# Patient Record
Sex: Male | Born: 1985 | Race: White | Hispanic: No | Marital: Married | Smoking: Former smoker
Health system: Southern US, Community
[De-identification: ages and names within clinical notes are randomized; demographics above are authoritative.]

## PROBLEM LIST (undated history)

## (undated) DIAGNOSIS — I10 Essential (primary) hypertension: Secondary | ICD-10-CM

## (undated) DIAGNOSIS — E669 Obesity, unspecified: Secondary | ICD-10-CM

## (undated) DIAGNOSIS — J189 Pneumonia, unspecified organism: Secondary | ICD-10-CM

## (undated) DIAGNOSIS — K219 Gastro-esophageal reflux disease without esophagitis: Secondary | ICD-10-CM

## (undated) DIAGNOSIS — F32A Depression, unspecified: Secondary | ICD-10-CM

## (undated) DIAGNOSIS — K5792 Diverticulitis of intestine, part unspecified, without perforation or abscess without bleeding: Secondary | ICD-10-CM

## (undated) DIAGNOSIS — F329 Major depressive disorder, single episode, unspecified: Secondary | ICD-10-CM

## (undated) DIAGNOSIS — G4730 Sleep apnea, unspecified: Secondary | ICD-10-CM

## (undated) DIAGNOSIS — F419 Anxiety disorder, unspecified: Secondary | ICD-10-CM

## (undated) DIAGNOSIS — G4700 Insomnia, unspecified: Secondary | ICD-10-CM

## (undated) HISTORY — PX: APPENDECTOMY: SHX54

## (undated) HISTORY — PX: WISDOM TOOTH EXTRACTION: SHX21

## (undated) HISTORY — PX: VASECTOMY: SHX75

## (undated) HISTORY — PX: COLONOSCOPY: SHX174

---

## 2013-05-19 ENCOUNTER — Ambulatory Visit: Payer: Self-pay | Admitting: Family Medicine

## 2014-08-18 ENCOUNTER — Ambulatory Visit: Payer: Self-pay | Admitting: Urology

## 2014-12-22 NOTE — Op Note (Signed)
PATIENT NAME:  Jeffrey Keller, Jeffrey Keller MR#:  [id number] DATE OF BIRTH:  [birth year]  DATE OF PROCEDURE:  08/18/2014  PREOPERATIVE DIAGNOSIS: Desires vasectomy.   POSTOPERATIVE DIAGNOSIS: Desires vasectomy.   PROCEDURE PERFORMED: Vasectomy.   ANESTHESIA: Monitored anesthesia care.   ATTENDING SURGEON: Claris Gladden, MD  SPECIMENS: None.   COMPLICATIONS: None.   INDICATION: This is a 29 year old male with 3 biological children who desires no further offspring. He underwent attempted vasectomy in the office; however, I was unable to isolate the vas due to a tight scrotum and thickened cord. My partner, Dr. Edwyna Shell, was also unsuccessful in the office. He was counseled to have the procedure done in the Operating Room with some sedation in order to help facilitate isolation of the structure. Risks and benefits of the procedure were explained in detail. The patient agreed to proceed as planned.   PROCEDURE: The patient was correctly identified in the preoperative holding area and informed consent was confirmed. He was brought to the operating suite and placed on the table in the supine position. At this time, a universal timeout protocol was performed. All team members were identified. Venodyne boots were placed and he was administered 600 mg of IV clindamycin given his PENICILLIN ALLERGY in the perioperative period. He was then placed under monitored anesthesia care with some sedation and a privacy drape was hung. He was prepped and draped in the standard surgical fashion. At this point in time, I attempted to isolate the right vas and a small incision was made in the right lateral hemiscrotum. I made several attempts to isolate the vas through this incision but was ultimately unsuccessful due to a very thickened cord. The decision was made to proceed with a larger midline incision through which both vasa could be identified. A good amount of local anesthesia using 0.5% Marcaine was administered in the midline,  subcutaneous skin, and bilateral cords for a total of 20 mL. The incision was then carried down through the subcutaneous tissues using Bovie electrocautery. The left testicle was delivered into the field and the tunica vaginalis was opened. The cord was then identified including the cord structures and the vas was palpated. Of note, there was a lipoma of the cord and the cord was relatively thick. The vas was isolated using towel clips and dissected out and ultimately transected. A portion of the vas was excised. The lumen of each end of the vas was cauterized using pinpoint cautery through the lumen of the vas. The testicular end of the vas was buried with a fascial interposition using a 3-0 chromic suture. The testicle was returned back into the tunica vaginalis, which was closed using a figure-of-eight 3-0 Vicryl suture. The left testicle was then returned back into its normal anatomic position and the same exact procedure was performed on the right side. The right testicle was delivered into the field and the vas was isolated. A portion of the vas was excised after it was transected and the bilateral ends were cauterized using pinpoint electrocautery within the lumen. Again, a fascial interposition was also performed on this side and the testicle was returned back into the tunica vaginalis and placed in normal anatomic position within the scrotum. The dartos was then closed using a running 3-0 chromic suture. The skin was closed using interrupted 3-0 chromic sutures. After the patient was cleaned and dried, Dermabond was additionally applied to the incision. Scrotal fluffs and a scrotal support device were applied. The patient was reversed from monitored anesthesia care,  taken to the PACU. He tolerated the procedure quite well. There were no complications in this case.    ____________________________ Claris GladdenAshley J. Calayah Guadarrama, MD ajb:at D: 08/18/2014 12:54:56 ET T: 08/18/2014 13:46:27  ET JOB#: [id number]  cc: Claris GladdenAshley J. Claudine Stallings, MD, <Dictator> Claris GladdenASHLEY J Elinor Kleine MD ELECTRONICALLY SIGNED 09/08/2014 15:44

## 2017-09-23 ENCOUNTER — Emergency Department (HOSPITAL_COMMUNITY)
Admission: EM | Admit: 2017-09-23 | Discharge: 2017-09-23 | Disposition: A | Discharge status: Home / Self Care | Payer: Managed Care, Other (non HMO) | Attending: Emergency Medicine | Admitting: Emergency Medicine

## 2017-09-23 ENCOUNTER — Other Ambulatory Visit: Payer: Self-pay

## 2017-09-23 ENCOUNTER — Encounter (HOSPITAL_COMMUNITY): Payer: Self-pay | Admitting: Nurse Practitioner

## 2017-09-23 DIAGNOSIS — R112 Nausea with vomiting, unspecified: Secondary | ICD-10-CM | POA: Diagnosis not present

## 2017-09-23 DIAGNOSIS — R197 Diarrhea, unspecified: Secondary | ICD-10-CM | POA: Diagnosis not present

## 2017-09-23 DIAGNOSIS — Z79899 Other long term (current) drug therapy: Secondary | ICD-10-CM | POA: Insufficient documentation

## 2017-09-23 DIAGNOSIS — R6883 Chills (without fever): Secondary | ICD-10-CM | POA: Insufficient documentation

## 2017-09-23 DIAGNOSIS — R1032 Left lower quadrant pain: Secondary | ICD-10-CM | POA: Diagnosis present

## 2017-09-23 HISTORY — DX: Major depressive disorder, single episode, unspecified: F32.9

## 2017-09-23 HISTORY — DX: Anxiety disorder, unspecified: F41.9

## 2017-09-23 HISTORY — DX: Diverticulitis of intestine, part unspecified, without perforation or abscess without bleeding: K57.92

## 2017-09-23 HISTORY — DX: Depression, unspecified: F32.A

## 2017-09-23 LAB — COMPREHENSIVE METABOLIC PANEL
ALBUMIN: 3.6 g/dL (ref 3.5–5.0)
ALK PHOS: 47 U/L (ref 38–126)
ALT: 37 U/L (ref 17–63)
AST: 26 U/L (ref 15–41)
Anion gap: 9 (ref 5–15)
BUN: 18 mg/dL (ref 6–20)
CALCIUM: 8.1 mg/dL — AB (ref 8.9–10.3)
CO2: 24 mmol/L (ref 22–32)
CREATININE: 0.93 mg/dL (ref 0.61–1.24)
Chloride: 106 mmol/L (ref 101–111)
GFR calc non Af Amer: >60 mL/min (ref >60)
GLUCOSE: 112 mg/dL — AB (ref 65–99)
Potassium: 3.6 mmol/L (ref 3.5–5.1)
SODIUM: 139 mmol/L (ref 135–145)
Total Bilirubin: 0.6 mg/dL (ref 0.3–1.2)
Total Protein: 7 g/dL (ref 6.5–8.1)

## 2017-09-23 LAB — CBC
HCT: 44.2 % (ref 39.0–52.0)
Hemoglobin: 15.3 g/dL (ref 13.0–17.0)
MCH: 29.7 pg (ref 26.0–34.0)
MCHC: 34.6 g/dL (ref 30.0–36.0)
MCV: 85.8 fL (ref 78.0–100.0)
PLATELETS: 197 10*3/uL (ref 150–400)
RBC: 5.15 MIL/uL (ref 4.22–5.81)
RDW: 13.1 % (ref 11.5–15.5)
WBC: 4.6 10*3/uL (ref 4.0–10.5)

## 2017-09-23 LAB — URINALYSIS, ROUTINE W REFLEX MICROSCOPIC
BILIRUBIN URINE: NEGATIVE
GLUCOSE, UA: NEGATIVE mg/dL
HGB URINE DIPSTICK: NEGATIVE
Ketones, ur: NEGATIVE mg/dL
Leukocytes, UA: NEGATIVE
Nitrite: NEGATIVE
PROTEIN: NEGATIVE mg/dL
Specific Gravity, Urine: 1.028 (ref 1.005–1.030)
pH: 5 (ref 5.0–8.0)

## 2017-09-23 LAB — LIPASE, BLOOD: Lipase: 21 U/L (ref 11–51)

## 2017-09-23 MED ORDER — METRONIDAZOLE 500 MG PO TABS: 500.0000 mg | ORAL_TABLET | Freq: Three times a day (TID) | ORAL | 0 refills | Status: DC

## 2017-09-23 MED ORDER — ONDANSETRON 4 MG PO TBDP
4.0000 mg | ORAL_TABLET | Freq: Once | ORAL | Status: AC | PRN
  Administered 2017-09-23: 4 mg via ORAL
  Filled 2017-09-23: qty 1

## 2017-09-23 MED ORDER — OXYCODONE-ACETAMINOPHEN 5-325 MG PO TABS: 1.0000 | ORAL_TABLET | ORAL | Status: DC | PRN

## 2017-09-23 MED ORDER — ONDANSETRON 4 MG PO TBDP
4.0000 mg | ORAL_TABLET | Freq: Three times a day (TID) | ORAL | 1 refills | Status: DC | PRN
Start: 2017-09-23 — End: 2020-03-18

## 2017-09-23 MED ORDER — OXYCODONE-ACETAMINOPHEN 5-325 MG PO TABS
2.0000 | ORAL_TABLET | Freq: Once | ORAL | Status: AC
  Administered 2017-09-23: 2 via ORAL
  Filled 2017-09-23: qty 2

## 2017-09-23 MED ORDER — CIPROFLOXACIN HCL 500 MG PO TABS
500.0000 mg | ORAL_TABLET | Freq: Once | ORAL | Status: AC
Start: 2017-09-23 — End: 2017-09-23
  Administered 2017-09-23: 500 mg via ORAL
  Filled 2017-09-23: qty 1

## 2017-09-23 MED ORDER — IBUPROFEN 400 MG PO TABS
400.0000 mg | ORAL_TABLET | Freq: Once | ORAL | Status: AC
  Administered 2017-09-23: 400 mg via ORAL
  Filled 2017-09-23: qty 1

## 2017-09-23 MED ORDER — DICYCLOMINE HCL 20 MG PO TABS: 20.0000 mg | ORAL_TABLET | Freq: Two times a day (BID) | ORAL | 0 refills | Status: DC | PRN

## 2017-09-23 MED ORDER — CIPROFLOXACIN HCL 500 MG PO TABS: 500.0000 mg | ORAL_TABLET | Freq: Two times a day (BID) | ORAL | 0 refills | Status: DC

## 2017-09-23 MED ORDER — ONDANSETRON 4 MG PO TBDP
8.0000 mg | ORAL_TABLET | Freq: Once | ORAL | Status: AC
  Administered 2017-09-23: 8 mg via ORAL
  Filled 2017-09-23: qty 2

## 2017-09-23 MED ORDER — METRONIDAZOLE 500 MG PO TABS
500.0000 mg | ORAL_TABLET | Freq: Once | ORAL | Status: AC
Start: 2017-09-23 — End: 2017-09-23
  Administered 2017-09-23: 500 mg via ORAL
  Filled 2017-09-23: qty 1

## 2017-09-23 NOTE — ED Provider Notes (Signed)
[facility] Provider Note   CSN: [id number] Arrival date & time: 09/23/17  1708    History   Chief Complaint Chief Complaint  Patient presents with  . Abdominal Pain    HPI Jeffrey Keller is a 32 y.o. male.  32 year old male with a history of diverticulitis diagnosed on colonoscopy at the age of 32 presents to the emergency department for evaluation of abdominal pain.  Abdominal pain began 48 hours ago and has been gradually worsening.  Pain located in the left lower quadrant.  He developed nausea and vomiting Saturday evening.  This has persisted through today.  He developed diarrhea yesterday and has had multiple loose stool.  No history of melena or hematochezia.  He also denies hematemesis, urinary symptoms.  He has had intermittent chills and endorses subjective fever; however, patient has been afebrile since arrival in the emergency department.  Patient states that symptoms are consistent with past episodes of diverticulitis.  Abdominal surgical history significant for appendectomy.  He is followed by a gastroenterologist with regular CT scans.  He reports a history of colon cancer in his brother with demise at the age of 34.      Past Medical History:  Diagnosis Date  . Anxiety   . Depression   . Diverticulitis     There are no active problems to display for this patient.   Past Surgical History:  Procedure Laterality Date  . APPENDECTOMY    . COLONOSCOPY         Home Medications    Prior to Admission medications   Medication Sig Start Date End Date Taking? Authorizing Provider  escitalopram (LEXAPRO) 10 MG tablet Take 10 mg by mouth daily.   Yes [provider]  Ibuprofen-Famotidine 800-26.6 MG TABS Take 1 tablet by mouth 3 (three) times daily as needed (pain/headache). Duexis   Yes [provider]  ciprofloxacin (CIPRO) 500 MG tablet Take 1 tablet (500 mg total) by mouth every 12 (twelve) hours. 09/23/17    Antony MaduraHumes, Maciel Kegg, PA-C  dicyclomine (BENTYL) 20 MG tablet Take 1 tablet (20 mg total) by mouth 2 (two) times daily as needed (abdominal pain/cramping). 09/23/17   Antony MaduraHumes, Jack Mineau, PA-C  metroNIDAZOLE (FLAGYL) 500 MG tablet Take 1 tablet (500 mg total) by mouth 3 (three) times daily. 09/23/17   Antony MaduraHumes, Mable Lashley, PA-C  ondansetron (ZOFRAN ODT) 4 MG disintegrating tablet Take 1-2 tablets (4-8 mg total) by mouth every 8 (eight) hours as needed for nausea or vomiting. 09/23/17   Antony MaduraHumes, Trev Boley, PA-C    Family History No family history on file.  Social History Social History   Tobacco Use  . Smoking status: Never Smoker  . Smokeless tobacco: Never Used  Substance Use Topics  . Alcohol use: Yes    Comment: weekend  . Drug use: No     Allergies   Penicillins   Review of Systems Review of Systems Ten systems reviewed and are negative for acute change, except as noted in the HPI.    Physical Exam Updated Vital Signs BP 101/60   Pulse 63   Temp 98.3 F (36.8 C) (Oral)   Resp 17   Ht 6' (1.829 m)   Wt 136.1 kg (300 lb)   SpO2 98%   BMI 40.69 kg/m   Physical Exam Constitutional: He is oriented to person, place, and time. He appears well-developed and well-nourished. No distress.  Nontoxic appearing and in NAD  HENT:  Head: Normocephalic and atraumatic.  Eyes: Conjunctivae and  EOM are normal. No scleral icterus.  Neck: Normal range of motion.  Cardiovascular: Normal rate, regular rhythm and intact distal pulses.  Pulmonary/Chest: Effort normal. No stridor. No respiratory distress. He has no wheezes.  Respirations even and unlabored  Abdominal: Soft. He exhibits no mass. There is tenderness. There is no guarding.  Soft abdomen with LLQ TTP. No peritoneal signs or distension.  Musculoskeletal: Normal range of motion.  Neurological: He is alert and oriented to person, place, and time. He exhibits normal muscle tone. Coordination normal.  Skin: Skin is warm and dry. No rash noted. He is not  diaphoretic. No erythema. No pallor.  Psychiatric: He has a normal mood and affect. His behavior is normal.  Nursing note and vitals reviewed.   ED Treatments / Results  Labs (all labs ordered are listed, but only abnormal results are displayed) Labs Reviewed  COMPREHENSIVE METABOLIC PANEL - Abnormal; Notable for the following components:      Result Value   Glucose, Bld 112 (*)    Calcium 8.1 (*)    All other components within normal limits  LIPASE, BLOOD  CBC  URINALYSIS, ROUTINE W REFLEX MICROSCOPIC    EKG  EKG Interpretation None       Radiology No results found.  Procedures Procedures (including critical care time)  Medications Ordered in ED Medications  ondansetron (ZOFRAN-ODT) disintegrating tablet 4 mg (4 mg Oral Given 09/23/17 1819)  ibuprofen (ADVIL,MOTRIN) tablet 400 mg (400 mg Oral Given 09/23/17 1823)  ondansetron (ZOFRAN-ODT) disintegrating tablet 8 mg (8 mg Oral Given 09/23/17 2259)  oxyCODONE-acetaminophen (PERCOCET/ROXICET) 5-325 MG per tablet 2 tablet (2 tablets Oral Given 09/23/17 2259)  ciprofloxacin (CIPRO) tablet 500 mg (500 mg Oral Given 09/23/17 2304)  metroNIDAZOLE (FLAGYL) tablet 500 mg (500 mg Oral Given 09/23/17 2304)     Initial Impression / Assessment and Plan / ED Course  I have reviewed the triage vital signs and the nursing notes.  Pertinent labs & imaging results that were available during my care of the patient were reviewed by me and considered in my medical decision making (see chart for details).     32 year old male with a history of diverticulitis diagnosed on colonoscopy at the age of 60 presents for left lower quadrant abdominal pain with associated nausea, vomiting, diarrhea.  Patient with focal tenderness in the left lower quadrant on exam.  No peritoneal signs.  Vital signs are stable and reassuring.  The patient also afebrile.  Laboratory workup initiated in triage.  This shows no leukocytosis or electrolyte derangements.   Liver and kidney function preserved.  Patient states that his symptoms feel similar to past diverticulitis flare.  Will empirically start on ciprofloxacin and Flagyl.  Low suspicion for complicating abdominal process.  Doubt intra-abdominal abscess or perforation.  Further doubt pSBO or SBO.  Discussed that symptoms may also be secondary to viral etiology.  Will provide prescriptions for supportive management including Bentyl and Zofran.  Return precautions discussed and provided. Patient discharged in stable condition with no unaddressed concerns.   Final Clinical Impressions(s) / ED Diagnoses   Final diagnoses:  Nausea vomiting and diarrhea    ED Discharge Orders        Ordered    ondansetron (ZOFRAN ODT) 4 MG disintegrating tablet  Every 8 hours PRN     09/23/17 2247    dicyclomine (BENTYL) 20 MG tablet  2 times daily PRN     09/23/17 2247    ciprofloxacin (CIPRO) 500 MG tablet  Every 12  hours     09/23/17 2247    metroNIDAZOLE (FLAGYL) 500 MG tablet  3 times daily     09/23/17 2247       Antony Madura, PA-C 09/23/17 2313    Shaune Pollack, MD 09/24/17 0040

## 2017-09-23 NOTE — ED Notes (Signed)
Results reviewed

## 2017-09-23 NOTE — ED Provider Notes (Signed)
Medical screening examination/treatment/procedure(s) were conducted as a shared visit with non-physician practitioner(s) and myself.  I personally evaluated the patient during the encounter. Briefly, the patient is a 32 year old male with history of diverticulitis diagnosed on colonoscopy at age 89 here with left lower quadrant pain, nausea, and loose stools.  Patient has a strong family history of recurrent diverticulitis and also lost a brother to colon cancer at a young age, so he regularly undergoes colonoscopy for screening.  On exam, the patient has minimal left lower quadrant tenderness but no rebound or guarding.  He has very reassuring lab work with a normal white blood cell count.  Vital signs are completely stable.  We discussed symptomatic treatment and empiric antibiotics versus CT imaging.  Patient states that he is normally able to resolve his episodes of diverticulitis with antibiotics and supportive care and does not feel CT is needed.  I feel this is very reasonable.  Based on shared decision making, will discharge with outpatient antibiotics and supportive care.  We discussed return precautions including any worsening pain, or other concerning symptoms.   EKG Interpretation None          Shaune PollackIsaacs, Jeffrey Wakefield, MD 09/23/17 2254

## 2017-09-23 NOTE — Discharge Instructions (Signed)
Your laboratory workup in the ED was reassuring.  Take ciprofloxacin and Flagyl as prescribed until finished for coverage of diverticulitis.  We recommend Bentyl for abdominal pain or cramping as well as Zofran for nausea.  Be sure you drink plenty of clear liquids to prevent dehydration.  Follow-up with your primary care doctor as well as your gastroenterologist regarding your visit today.  Return for new or concerning symptoms.

## 2017-09-23 NOTE — ED Triage Notes (Signed)
Pt endorses abdominal pain and cramping, diarrhea x4 days pt sts nausea and vomiting started 2 days ago. Pt endorses 6-7 episodes of diarrhea in the past 24 hours. Pt sts this is characteristic of his diverticulitis flare up but pain is more severe. Pt sts has been following diet appropriately but had significant stressor with death of father.  +appendectomy

## 2019-10-30 ENCOUNTER — Other Ambulatory Visit: Payer: Self-pay | Admitting: General Surgery

## 2019-10-30 ENCOUNTER — Other Ambulatory Visit (HOSPITAL_COMMUNITY): Payer: Self-pay | Admitting: General Surgery

## 2019-11-11 ENCOUNTER — Other Ambulatory Visit: Payer: Self-pay

## 2019-11-11 ENCOUNTER — Encounter: Payer: Managed Care, Other (non HMO) | Attending: General Surgery | Admitting: Skilled Nursing Facility1

## 2019-11-11 ENCOUNTER — Encounter: Payer: Self-pay | Admitting: Skilled Nursing Facility1

## 2019-11-11 DIAGNOSIS — Z79899 Other long term (current) drug therapy: Secondary | ICD-10-CM | POA: Diagnosis not present

## 2019-11-11 DIAGNOSIS — Z833 Family history of diabetes mellitus: Secondary | ICD-10-CM | POA: Insufficient documentation

## 2019-11-11 DIAGNOSIS — Z8249 Family history of ischemic heart disease and other diseases of the circulatory system: Secondary | ICD-10-CM | POA: Diagnosis not present

## 2019-11-11 DIAGNOSIS — Z87891 Personal history of nicotine dependence: Secondary | ICD-10-CM | POA: Diagnosis not present

## 2019-11-11 DIAGNOSIS — Z713 Dietary counseling and surveillance: Secondary | ICD-10-CM | POA: Diagnosis present

## 2019-11-11 DIAGNOSIS — F329 Major depressive disorder, single episode, unspecified: Secondary | ICD-10-CM | POA: Insufficient documentation

## 2019-11-11 DIAGNOSIS — E669 Obesity, unspecified: Secondary | ICD-10-CM

## 2019-11-11 DIAGNOSIS — F419 Anxiety disorder, unspecified: Secondary | ICD-10-CM | POA: Diagnosis not present

## 2019-11-11 DIAGNOSIS — I10 Essential (primary) hypertension: Secondary | ICD-10-CM | POA: Insufficient documentation

## 2019-11-11 DIAGNOSIS — Z8371 Family history of colonic polyps: Secondary | ICD-10-CM | POA: Diagnosis not present

## 2019-11-11 DIAGNOSIS — Z800 Family history of malignant neoplasm of digestive organs: Secondary | ICD-10-CM | POA: Insufficient documentation

## 2019-11-11 DIAGNOSIS — Z880 Allergy status to penicillin: Secondary | ICD-10-CM | POA: Diagnosis not present

## 2019-11-11 DIAGNOSIS — G4730 Sleep apnea, unspecified: Secondary | ICD-10-CM | POA: Insufficient documentation

## 2019-11-11 DIAGNOSIS — Z6841 Body Mass Index (BMI) 40.0 and over, adult: Secondary | ICD-10-CM | POA: Insufficient documentation

## 2019-11-11 NOTE — Progress Notes (Signed)
Nutrition Assessment for Bariatric Surgery Medical Nutrition Therapy  Patient was seen on 11/11/2019 for Pre-Operative Nutrition Assessment. Letter of approval faxed to [facility] bariatric surgery program coordinator on 11/11/2019   Referral stated Supervised Weight Loss (SWL) visits needed: 0  Planned surgery: sleeve Pt expectation of surgery: to lose weight Pt expectation of dietitian: none stated    NUTRITION ASSESSMENT   Anthropometrics  Start weight at [facility]: 314.3 lbs (date: 11/11/2019)  Height: 61.5 in BMI: 43.23 kg/m2     Clinical  Medical hx: diverticulosis (last flare being 2 years ago) Medications: see list Labs:  Notable signs/symptoms: none stated  Lifestyle & Dietary Hx  Pt states he eats massive quantities at once.   Pt states he drinks beer on the weekends (15).   24-Hr Dietary Recall First Meal: half pot coffee Snack: energy drink Second Meal: leftovers (chicken thighs, frozen noodles) Snack:  Third Meal: sauasage tacos and rice Snack: pork rinds Beverages: coffee, energy drink, water, beer, gatorade, soda   Estimated Energy Needs Calories: 1800   NUTRITION DIAGNOSIS  Overweight/obesity ([place]-3.3) related to past poor dietary habits and physical inactivity as evidenced by patient w/ planned Sleeve Gastrectomy surgery following dietary guidelines for continued weight loss.    NUTRITION INTERVENTION  Nutrition counseling (C-1) and education (E-2) to facilitate bariatric surgery goals.   Pre-Op Goals Reviewed with the Patient . Track food and beverage intake (pen and paper, MyFitness Pal, Baritastic app, etc.) . Make healthy food choices while monitoring portion sizes . Consume 3 meals per day or try to eat every 3-5 hours . Avoid concentrated sugars and fried foods . Keep sugar & fat in the single digits per serving on food labels . Practice CHEWING your food (aim for applesauce consistency) . Practice not drinking 15 minutes before,  during, and 30 minutes after each meal and snack . Avoid all carbonated beverages (ex: soda, sparkling beverages)  . Limit caffeinated beverages (ex: coffee, tea, energy drinks) . Avoid all sugar-sweetened beverages (ex: regular soda, sports drinks)  . Avoid alcohol  . Aim for 64-100 ounces of FLUID daily (with at least half of fluid intake being plain water)  . Aim for at least 60-80 grams of PROTEIN daily . Look for a liquid protein source that contains ?15 g protein and ?5 g carbohydrate (ex: shakes, drinks, shots) . Make a list of non-food related activities . Physical activity is an important part of a healthy lifestyle so keep it moving! The goal is to reach 150 minutes of exercise per week, including cardiovascular and weight baring activity.  *Goals that are bolded indicate the pt would like to start working towards these  Handouts Provided Include  . Bariatric Surgery handouts (Nutrition Visits, Pre-Op Goals, Protein Shakes, Vitamins & Minerals)  Learning Style & Readiness for Change Teaching method utilized: Visual & Auditory  Demonstrated degree of understanding via: Teach Back  Barriers to learning/adherence to lifestyle change: none identified     MONITORING & EVALUATION Dietary intake, weekly physical activity, body weight, and pre-op goals reached at next nutrition visit.    Next Steps  Patient is to follow up at [facility] for Pre-Op Class >2 weeks before surgery for further nutrition education.

## 2019-11-26 DIAGNOSIS — U071 COVID-19: Secondary | ICD-10-CM

## 2019-11-26 HISTORY — DX: COVID-19: U07.1

## 2019-12-11 ENCOUNTER — Ambulatory Visit: Payer: Managed Care, Other (non HMO)

## 2019-12-14 ENCOUNTER — Encounter (HOSPITAL_BASED_OUTPATIENT_CLINIC_OR_DEPARTMENT_OTHER): Payer: Self-pay | Admitting: *Deleted

## 2019-12-14 ENCOUNTER — Emergency Department (HOSPITAL_BASED_OUTPATIENT_CLINIC_OR_DEPARTMENT_OTHER): Payer: Managed Care, Other (non HMO)

## 2019-12-14 ENCOUNTER — Other Ambulatory Visit
Admission: RE | Admit: 2019-12-14 | Discharge: 2019-12-14 | Disposition: A | Discharge status: Home / Self Care | Payer: Managed Care, Other (non HMO) | Source: Ambulatory Visit | Attending: Pediatrics | Admitting: Pediatrics

## 2019-12-14 ENCOUNTER — Emergency Department (HOSPITAL_BASED_OUTPATIENT_CLINIC_OR_DEPARTMENT_OTHER)
Admission: EM | Admit: 2019-12-14 | Discharge: 2019-12-14 | Disposition: A | Discharge status: Home / Self Care | Payer: Managed Care, Other (non HMO) | Attending: Emergency Medicine | Admitting: Emergency Medicine

## 2019-12-14 ENCOUNTER — Other Ambulatory Visit: Payer: Self-pay

## 2019-12-14 DIAGNOSIS — R05 Cough: Secondary | ICD-10-CM | POA: Insufficient documentation

## 2019-12-14 DIAGNOSIS — Z79899 Other long term (current) drug therapy: Secondary | ICD-10-CM | POA: Insufficient documentation

## 2019-12-14 DIAGNOSIS — R0602 Shortness of breath: Secondary | ICD-10-CM | POA: Diagnosis not present

## 2019-12-14 DIAGNOSIS — U071 COVID-19: Secondary | ICD-10-CM | POA: Insufficient documentation

## 2019-12-14 LAB — CBC WITH DIFFERENTIAL/PLATELET
Abs Immature Granulocytes: 0.02 10*3/uL (ref 0.00–0.07)
Basophils Absolute: 0 10*3/uL (ref 0.0–0.1)
Basophils Relative: 0 %
Eosinophils Absolute: 0.1 10*3/uL (ref 0.0–0.5)
Eosinophils Relative: 2 %
HCT: 44.7 % (ref 39.0–52.0)
Hemoglobin: 15.6 g/dL (ref 13.0–17.0)
Immature Granulocytes: 0 %
Lymphocytes Relative: 31 %
Lymphs Abs: 1.7 10*3/uL (ref 0.7–4.0)
MCH: 30.2 pg (ref 26.0–34.0)
MCHC: 34.9 g/dL (ref 30.0–36.0)
MCV: 86.5 fL (ref 80.0–100.0)
Monocytes Absolute: 0.6 10*3/uL (ref 0.1–1.0)
Monocytes Relative: 10 %
Neutro Abs: 3.2 10*3/uL (ref 1.7–7.7)
Neutrophils Relative %: 57 %
Platelets: 210 10*3/uL (ref 150–400)
RBC: 5.17 MIL/uL (ref 4.22–5.81)
RDW: 12.2 % (ref 11.5–15.5)
WBC: 5.7 10*3/uL (ref 4.0–10.5)
nRBC: 0 % (ref 0.0–0.2)

## 2019-12-14 LAB — BASIC METABOLIC PANEL
Anion gap: 10 (ref 5–15)
BUN: 19 mg/dL (ref 6–20)
CO2: 26 mmol/L (ref 22–32)
Calcium: 9.1 mg/dL (ref 8.9–10.3)
Chloride: 101 mmol/L (ref 98–111)
Creatinine, Ser: 1.15 mg/dL (ref 0.61–1.24)
GFR calc Af Amer: >60 mL/min (ref >60)
GFR calc non Af Amer: >60 mL/min (ref >60)
Glucose, Bld: 99 mg/dL (ref 70–99)
Potassium: 3.9 mmol/L (ref 3.5–5.1)
Sodium: 137 mmol/L (ref 135–145)

## 2019-12-14 LAB — FIBRIN DERIVATIVES D-DIMER (ARMC ONLY): Fibrin derivatives D-dimer (ARMC): 510.29 ng/mL (FEU) — ABNORMAL HIGH (ref 0.00–499.00)

## 2019-12-14 MED ORDER — IOHEXOL 350 MG/ML SOLN
100.0000 mL | Freq: Once | INTRAVENOUS | Status: AC | PRN
  Administered 2019-12-14: 100 mL via INTRAVENOUS

## 2019-12-14 NOTE — ED Provider Notes (Signed)
[facility] EMERGENCY DEPARTMENT Provider Note   CSN: [id number] Arrival date & time: 12/14/19  1830     History Chief Complaint  Patient presents with  . Shortness of Breath    Jeffrey Keller is a 34 y.o. male.  HPI     Jeffrey Keller is a 34 y.o. male, with a history of anxiety, depression, Covid, presenting to the ED with cough and shortness of breath beginning Saturday, April 10. Tested positive for Covid Thursday, April 15. Went to a walk-in clinic today due to his shortness of breath.  There was opacity noted on the chest x-ray performed at that time.  They obtained a D-dimer and it was positive, which prompted them to send him to the ED for CT scan.  He additionally notes he was prescribed albuterol inhaler and course of prednisone by the outside facility.  Denies current fever, chest pain, abdominal pain, dizziness, syncope, N/V/D, lower extremity swelling/pain, or any other complaints.  Past Medical History:  Diagnosis Date  . Anxiety   . Depression   . Diverticulitis     There are no problems to display for this patient.   Past Surgical History:  Procedure Laterality Date  . APPENDECTOMY    . COLONOSCOPY         History reviewed. No pertinent family history.  Social History   Tobacco Use  . Smoking status: Never Smoker  . Smokeless tobacco: Never Used  Substance Use Topics  . Alcohol use: Yes    Comment: weekend  . Drug use: No    Home Medications Prior to Admission medications   Medication Sig Start Date End Date Taking? Authorizing Provider  ciprofloxacin (CIPRO) 500 MG tablet Take 1 tablet (500 mg total) by mouth every 12 (twelve) hours. 09/23/17   Antony Madura, PA-C  dicyclomine (BENTYL) 20 MG tablet Take 1 tablet (20 mg total) by mouth 2 (two) times daily as needed (abdominal pain/cramping). 09/23/17   Antony Madura, PA-C  escitalopram (LEXAPRO) 10 MG tablet Take 10 mg by mouth daily.    [provider]  Ibuprofen-Famotidine  800-26.6 MG TABS Take 1 tablet by mouth 3 (three) times daily as needed (pain/headache). Duexis    [provider]  metroNIDAZOLE (FLAGYL) 500 MG tablet Take 1 tablet (500 mg total) by mouth 3 (three) times daily. 09/23/17   Antony Madura, PA-C  ondansetron (ZOFRAN ODT) 4 MG disintegrating tablet Take 1-2 tablets (4-8 mg total) by mouth every 8 (eight) hours as needed for nausea or vomiting. 09/23/17   Antony Madura, PA-C    Allergies    Penicillins  Review of Systems   Review of Systems  Constitutional: Negative for chills, diaphoresis and fever.  Respiratory: Positive for cough and shortness of breath.   Cardiovascular: Negative for chest pain and leg swelling.  Gastrointestinal: Negative for abdominal pain, diarrhea, nausea and vomiting.  Neurological: Negative for dizziness, syncope and weakness.  All other systems reviewed and are negative.   Physical Exam Updated Vital Signs BP 126/76 (BP Location: Left Arm)   Pulse 69   Temp 98.4 F (36.9 C) (Oral)   Resp 18   Ht 6' (1.829 m)   Wt (!) 142.9 kg   SpO2 98%   BMI 42.72 kg/m   Physical Exam Vitals and nursing note reviewed.  Constitutional:      General: He is not in acute distress.    Appearance: He is well-developed. He is not diaphoretic.  HENT:     Head: Normocephalic and  atraumatic.     Mouth/Throat:     Mouth: Mucous membranes are moist.     Pharynx: Oropharynx is clear.  Eyes:     Conjunctiva/sclera: Conjunctivae normal.  Cardiovascular:     Rate and Rhythm: Normal rate and regular rhythm.     Pulses: Normal pulses.          Radial pulses are 2+ on the right side and 2+ on the left side.       Posterior tibial pulses are 2+ on the right side and 2+ on the left side.     Heart sounds: Normal heart sounds.     Comments: Tactile temperature in the extremities appropriate and equal bilaterally. Pulmonary:     Effort: Pulmonary effort is normal. No respiratory distress.     Breath sounds: Normal breath  sounds.     Comments: No increased work of breathing.  Speaks in full sentences without noted difficulty. Abdominal:     Palpations: Abdomen is soft.     Tenderness: There is no abdominal tenderness. There is no guarding.  Musculoskeletal:     Cervical back: Neck supple.     Right lower leg: No edema.     Left lower leg: No edema.     Comments: No lower extremity edema, color change, temperature abnormality, or tenderness.  Lymphadenopathy:     Cervical: No cervical adenopathy.  Skin:    General: Skin is warm and dry.  Neurological:     Mental Status: He is alert.  Psychiatric:        Mood and Affect: Mood and affect normal.        Speech: Speech normal.        Behavior: Behavior normal.     ED Results / Procedures / Treatments   Labs (all labs ordered are listed, but only abnormal results are displayed) Labs Reviewed  CBC WITH DIFFERENTIAL/PLATELET  BASIC METABOLIC PANEL    EKG None  Radiology CT Angio Chest PE W and/or Wo Contrast  Result Date: 12/14/2019 CLINICAL DATA:  Mild shortness of breath. COVID positive. EXAM: CT ANGIOGRAPHY CHEST WITH CONTRAST TECHNIQUE: Multidetector CT imaging of the chest was performed using the standard protocol during bolus administration of intravenous contrast. Multiplanar CT image reconstructions and MIPs were obtained to evaluate the vascular anatomy. CONTRAST:  OMNIPAQUE IOHEXOL 350 MG/ML SOLN COMPARISON:  None. FINDINGS: Cardiovascular: Satisfactory opacification of the pulmonary arteries to the segmental level. No evidence of pulmonary embolism. Normal heart size. No pericardial effusion. No thoracic aortic aneurysm or dissection. Mediastinum/Nodes: No enlarged mediastinal, hilar, or axillary lymph nodes. Small mediastinal and hilar lymph nodes are likely reactive. Thyroid gland, trachea, and esophagus demonstrate no significant findings. Lungs/Pleura: Patchy asymmetric ground-glass densities throughout both lungs. No pleural effusion  or pneumothorax. Upper Abdomen: No acute abnormality. Musculoskeletal: No chest wall abnormality. No acute or significant osseous findings. Review of the MIP images confirms the above findings. IMPRESSION: 1. No evidence of pulmonary embolism. 2. Patchy asymmetric ground-glass densities throughout both lungs, consistent with multifocal pneumonia related to COVID-19 infection. Electronically Signed   By: Obie Dredge M.D.   On: 12/14/2019 20:37    Procedures Procedures (including critical care time)  Medications Ordered in ED Medications  iohexol (OMNIPAQUE) 350 MG/ML injection 100 mL (100 mLs Intravenous Contrast Given 12/14/19 2023)    ED Course  I have reviewed the triage vital signs and the nursing notes.  Pertinent labs & imaging results that were available during my care of the patient  were reviewed by me and considered in my medical decision making (see chart for details).  Clinical Course as of Dec 15 1519  Mon Dec 14, 2019  2035 This is an erroneous reading calculated off the SPO2 sensor.  Patient was not noted to be tachypneic when manually reviewed.  Resp(!): 24 [SJ]    Clinical Course User Index [SJ] Darnel Mchan, Helane Gunther, PA-C   MDM Rules/Calculators/A&P                      Patient presents for CT scan due to positive D-dimer in the setting of COVID-19. Patient's notes and D-dimer results were reviewed.  D-dimer result noted to be 510 ng/mL, with normal range noted to be 0 to 499 ng/mL, thus indicating quite mild elevation. Mildly elevated D-dimer can be an expected finding in the setting of COVID-19 infection. I personally reviewed and interpreted the patient's labs and imaging studies.  Patient is nontoxic appearing, afebrile, not tachycardic, not tachypneic, not hypotensive, maintains excellent SPO2 on room air, and is in no apparent distress.  Lab work here in the ED was unremarkable and reassuring.  CT without evidence of PE.  Shows multifocal pneumonia consistent with  COVID-19 infection. Patient already has appropriate medications prescribed for symptom management. The patient was given instructions for home care as well as return precautions. Patient voices understanding of these instructions, accepts the plan, and is comfortable with discharge.  Vitals:   12/14/19 1851 12/14/19 1945 12/14/19 2000 12/14/19 2033  BP: 126/76 127/76 126/79 117/70  Pulse: 69 72 77 70  Resp: 18 18 (!) 26 (!) 24  Temp: 98.4 F (36.9 C)     TempSrc: Oral     SpO2: 98% 98% 99% 98%  Weight:      Height:       Jeffrey Keller was evaluated in Emergency Department on 12/14/2019 for the symptoms described in the history of present illness. He was evaluated in the context of the global COVID-19 pandemic, which necessitated consideration that the patient might be at risk for infection with the SARS-CoV-2 virus that causes COVID-19. Institutional protocols and algorithms that pertain to the evaluation of patients at risk for COVID-19 are in a state of rapid change based on information released by regulatory bodies including the CDC and federal and state organizations. These policies and algorithms were followed during the patient's care in the ED.   Final Clinical Impression(s) / ED Diagnoses Final diagnoses:  [id number]    Rx / DC Orders ED Discharge Orders    None       Layla Maw 12/15/19 [place], [place], DO 12/15/19 1604

## 2019-12-14 NOTE — ED Triage Notes (Signed)
covid positive today. Reports elevated d-dimer at PCP office. Denies CP. Reports slight SOB-ambulatory without noted distress. Speaking in complete sentences.

## 2019-12-14 NOTE — Discharge Instructions (Signed)
General Viral Syndrome Care Instructions:  Your symptoms are likely consistent with a viral illness, specifically COVID-19 infection. Viruses do not require or respond to antibiotics. Treatment is symptomatic care and it is important to note that these symptoms may last for 7-14 days.   Hand washing: Wash your hands throughout the day, but especially before and after touching the face, using the restroom, sneezing, coughing, or touching surfaces that have been coughed or sneezed upon. Hydration: Symptoms of most illnesses will be intensified and complicated by dehydration. Dehydration can also extend the duration of symptoms. Drink plenty of fluids and get plenty of rest. You should be drinking at least half a liter of water an hour to stay hydrated. Electrolyte drinks (ex. Gatorade, Powerade, Pedialyte) are also encouraged. You should be drinking enough fluids to make your urine light yellow, almost clear. If this is not the case, you are not drinking enough water. Please note that some of the treatments indicated below will not be effective if you are not adequately hydrated. Diet: Please concentrate on hydration, however, you may introduce food slowly.  Start with a clear liquid diet, progressed to a full liquid diet, and then bland solids as you are able. Pain or fever: Ibuprofen, Naproxen, or acetaminophen (generic for Tylenol) for pain or fever.  Antiinflammatory medications: Take 600 mg of ibuprofen every 6 hours or 440 mg (over the counter dose) to 500 mg (prescription dose) of naproxen every 12 hours for the next 3 days. After this time, these medications may be used as needed for pain. Take these medications with food to avoid upset stomach. Choose only one of these medications, do not take them together. Acetaminophen (generic for Tylenol): Should you continue to have additional pain while taking the ibuprofen or naproxen, you may add in acetaminophen as needed. Your daily total maximum amount  of acetaminophen from all sources should be limited to 4000mg /day for persons without liver problems, or 2000mg /day for those with liver problems. Albuterol: May use the albuterol as needed for instances of shortness of breath. Prednisone: Take the prednisone, as directed, in its entirety. Zyrtec or Claritin: May add these medication daily to control underlying symptoms of congestion, sneezing, and other signs of allergies.  These medications are available over-the-counter. Generics: Cetirizine (generic for Zyrtec) and loratadine (generic for Claritin). Fluticasone: Use fluticasone (generic for Flonase), as directed, for nasal and sinus congestion.  This medication is available over-the-counter. Congestion: Plain guaifenesin (generic for plain Mucinex) may help relieve congestion. Saline sinus rinses and saline nasal sprays may also help relieve congestion.  Sore throat: Warm liquids or Chloraseptic spray may help soothe a sore throat. Gargle twice a day with a salt water solution made from a half teaspoon of salt in a cup of warm water.  Follow up: Follow up with a primary care provider within the next two weeks should symptoms fail to resolve. Return: Return to the ED for significantly worsening symptoms, shortness of breath, persistent vomiting, large amounts of blood in stool, or any other major concerns.  For prescription assistance, may try using prescription discount sites or apps, such as goodrx.com

## 2019-12-15 ENCOUNTER — Ambulatory Visit (INDEPENDENT_AMBULATORY_CARE_PROVIDER_SITE_OTHER): Payer: 59 | Admitting: Psychology

## 2019-12-31 ENCOUNTER — Ambulatory Visit: Payer: 59 | Admitting: Psychology

## 2020-01-11 ENCOUNTER — Other Ambulatory Visit: Payer: Self-pay

## 2020-01-11 ENCOUNTER — Encounter: Payer: Managed Care, Other (non HMO) | Attending: General Surgery | Admitting: Skilled Nursing Facility1

## 2020-01-11 DIAGNOSIS — E669 Obesity, unspecified: Secondary | ICD-10-CM | POA: Insufficient documentation

## 2020-01-11 NOTE — Progress Notes (Signed)
Pre-Operative Nutrition Class:  Appt start time: 8381   End time:  1830.  Patient was seen on 01/11/2020 for Pre-Operative Bariatric Surgery Education at [facility].    Surgery date:  Surgery type: sleeve Start weight at [facility]: 314.3 Weight today: 314.7  Samples given per [facility] protocol. Patient educated on appropriate usage: Celebrate Multivitamin Lot # [id number] Exp: 11/2020  celebrate Calcium  Lot # [id number] Exp: 03/2021  premier Protein   Shake Lot # [id number] Exp: 04/24/20  The following the learning objectives were met by the patient during this course:  Identify Pre-Op Dietary Goals and will begin 2 weeks pre-operatively  Identify appropriate sources of fluids and proteins   State protein recommendations and appropriate sources pre and post-operatively  Identify Post-Operative Dietary Goals and will follow for 2 weeks post-operatively  Identify appropriate multivitamin and calcium sources  Describe the need for physical activity post-operatively and will follow MD recommendations  State when to call healthcare provider regarding medication questions or post-operative complications  Handouts given during class include:  Pre-Op Bariatric Surgery Diet Handout  Protein Shake Handout  Post-Op Bariatric Surgery Nutrition Handout  BELT Program Information Flyer  Support Group Information Flyer  [facility] Bariatric Supplements Price List  Follow-Up Plan: Patient will follow-up at [facility] 2 weeks post operatively for diet advancement per MD.

## 2020-02-12 ENCOUNTER — Ambulatory Visit
Admission: RE | Admit: 2020-02-12 | Discharge: 2020-02-12 | Disposition: A | Discharge status: Home / Self Care | Payer: Managed Care, Other (non HMO) | Source: Ambulatory Visit | Attending: General Surgery | Admitting: General Surgery

## 2020-02-12 ENCOUNTER — Other Ambulatory Visit: Payer: Self-pay

## 2020-03-24 ENCOUNTER — Encounter (HOSPITAL_COMMUNITY): Payer: Self-pay

## 2020-03-24 NOTE — Progress Notes (Addendum)
Please enter orders for PAT visit scheduled for 04-01-20 for surgery 04-12-20.  Thank you  Requested orders by telephone on 03-31-2020.

## 2020-03-24 NOTE — Patient Instructions (Addendum)
DUE TO COVID-19 ONLY ONE VISITOR ARE ALLOWED TO COME WITH YOU AND STAY IN THE WAITING ROOM ONLY DURING PRE OP AND PROCEDURE. THEN TWO VISITORS MAY VISIT WITH YOU IN YOUR PRIVATE ROOM DURING VISITING HOURS ONLY!! (10AM-8PM)   COVID SWAB TESTING MUST BE COMPLETED ON:    Friday, 04-08-20 @ 11 AM      [address] [place], [place] [address]  (Must self quarantine after testing. Follow instructions on handout.)       Your procedure is scheduled on: Tuesday,  04-12-20   Report to [facility] Main  Entrance   Report to Short Stay at 5:30 AM   [facility])    Call this number if you have problems the morning of surgery [phone]   Do not eat food :After Midnight.     Oral Hygiene is also important to reduce your risk of infection.                                    Remember - BRUSH YOUR TEETH THE MORNING OF SURGERY WITH YOUR REGULAR TOOTHPASTE   Do NOT smoke after Midnight   Take these medicines the morning of surgery with A SIP OF WATER:  Venlafaxine (Effexor)                                 You may not have any metal on your body including jewelry, and body piercings              Do not wear lotions, powders, cologne, or deodorant                          Men may shave face and neck.     Do not bring valuables to the hospital. [facility] IS NOT RESPONSIBLE   FOR VALUABLES.   Contacts, dentures or bridgework may not be worn into surgery.   Bring small overnight bag day of surgery.     Special Instructions: Bring a copy of your healthcare power of attorney and living will documents the day of surgery if you haven't scanned them in  before.              Please read over the following fact sheets you were given: IF YOU HAVE QUESTIONS ABOUT YOUR PRE OP INSTRUCTIONS PLEASE CALL  [phone]   [facility] - Preparing for Surgery Before surgery, you can play an important role.  Because skin is not sterile, your skin needs to be as free of germs as possible.  You can  reduce the number of germs on your skin by washing with CHG (chlorahexidine gluconate) soap before surgery.  CHG is an antiseptic cleaner which kills germs and bonds with the skin to continue killing germs even after washing. Please DO NOT use if you have an allergy to CHG or antibacterial soaps.  If your skin becomes reddened/irritated stop using the CHG and inform your nurse when you arrive at Short Stay. Do not shave (including legs and underarms) for at least 48 hours prior to the first CHG shower.  You may shave your face/neck.  Please follow these instructions carefully:  1.  Shower with CHG Soap the night before surgery and the  morning of surgery.  2.  If you choose to wash your hair, wash your  hair first as usual with your normal  shampoo.  3.  After you shampoo, rinse your hair and body thoroughly to remove the shampoo.                             4.  Use CHG as you would any other liquid soap.  You can apply chg directly to the skin and wash.  Gently with a scrungie or clean washcloth.  5.  Apply the CHG Soap to your body ONLY FROM THE NECK DOWN.   Do   not use on face/ open                           Wound or open sores. Avoid contact with eyes, ears mouth and   genitals (private parts).                       Wash face,  Genitals (private parts) with your normal soap.             6.  Wash thoroughly, paying special attention to the area where your    surgery  will be performed.  7.  Thoroughly rinse your body with warm water from the neck down.  8.  DO NOT shower/wash with your normal soap after using and rinsing off the CHG Soap.                9.  Pat yourself dry with a clean towel.            10.  Wear clean pajamas.            11.  Place clean sheets on your bed the night of your first shower and do not  sleep with pets. Day of Surgery : Do not apply any lotions/deodorants the morning of surgery.  Please wear clean clothes to the hospital/surgery center.  FAILURE TO FOLLOW THESE  INSTRUCTIONS MAY RESULT IN THE CANCELLATION OF YOUR SURGERY  PATIENT SIGNATURE_________________________________  NURSE SIGNATURE__________________________________  ________________________________________________________________________

## 2020-03-24 NOTE — Progress Notes (Addendum)
COVID Vaccine Completed:  x1 Date COVID Vaccine completed:  12-10-2019 COVID vaccine manufacturer: Pfizer    Moderna   [employer]   PCP -  Dr. Jerl Mina in [place] Cardiologist - N/A  Chest x-ray - 02-12-20 in Epic EKG - 12-15-19 in Epic Stress Test - N/A  ECHO - N/A  Cardiac Cath - N/A   Sleep Study - 2+ years ago CPAP - No   Fasting Blood Sugar - N/A  Checks Blood Sugar _____ times a day  Blood Thinner Instructions:N/A  Aspirin Instructions:N/A  Last Dose:  Anesthesia review:  Sleep apnea (no CPAP).  Covid pneumonia in April with SOB  Patient denies shortness of breath, fever, cough and chest pain at PAT appointment   Patient verbalized understanding of instructions that were given to them at the PAT appointment. Patient was also instructed that they will need to review over the PAT instructions again at home before surgery.

## 2020-04-01 ENCOUNTER — Encounter (HOSPITAL_COMMUNITY)
Admission: RE | Admit: 2020-04-01 | Discharge: 2020-04-01 | Disposition: A | Discharge status: Home / Self Care | Payer: Managed Care, Other (non HMO) | Source: Ambulatory Visit | Attending: General Surgery | Admitting: General Surgery

## 2020-04-01 ENCOUNTER — Other Ambulatory Visit: Payer: Self-pay

## 2020-04-01 ENCOUNTER — Encounter (HOSPITAL_COMMUNITY): Payer: Self-pay

## 2020-04-01 ENCOUNTER — Encounter (INDEPENDENT_AMBULATORY_CARE_PROVIDER_SITE_OTHER): Payer: Self-pay

## 2020-04-01 DIAGNOSIS — Z01812 Encounter for preprocedural laboratory examination: Secondary | ICD-10-CM | POA: Insufficient documentation

## 2020-04-01 HISTORY — DX: Gastro-esophageal reflux disease without esophagitis: K21.9

## 2020-04-01 HISTORY — DX: Essential (primary) hypertension: I10

## 2020-04-01 HISTORY — DX: Insomnia, unspecified: G47.00

## 2020-04-01 HISTORY — DX: Sleep apnea, unspecified: G47.30

## 2020-04-01 HISTORY — DX: Pneumonia, unspecified organism: J18.9

## 2020-04-01 HISTORY — DX: Obesity, unspecified: E66.9

## 2020-04-01 LAB — BASIC METABOLIC PANEL
Anion gap: 9 (ref 5–15)
BUN: 22 mg/dL — ABNORMAL HIGH (ref 6–20)
CO2: 26 mmol/L (ref 22–32)
Calcium: 9.6 mg/dL (ref 8.9–10.3)
Chloride: 102 mmol/L (ref 98–111)
Creatinine, Ser: 1.08 mg/dL (ref 0.61–1.24)
GFR calc Af Amer: >60 mL/min (ref >60)
GFR calc non Af Amer: >60 mL/min (ref >60)
Glucose, Bld: 106 mg/dL — ABNORMAL HIGH (ref 70–99)
Potassium: 4 mmol/L (ref 3.5–5.1)
Sodium: 137 mmol/L (ref 135–145)

## 2020-04-01 LAB — CBC
HCT: 47 % (ref 39.0–52.0)
Hemoglobin: 16.2 g/dL (ref 13.0–17.0)
MCH: 29.4 pg (ref 26.0–34.0)
MCHC: 34.5 g/dL (ref 30.0–36.0)
MCV: 85.3 fL (ref 80.0–100.0)
Platelets: 256 10*3/uL (ref 150–400)
RBC: 5.51 MIL/uL (ref 4.22–5.81)
RDW: 12.4 % (ref 11.5–15.5)
WBC: 7.5 10*3/uL (ref 4.0–10.5)
nRBC: 0 % (ref 0.0–0.2)

## 2020-04-01 LAB — TYPE AND SCREEN
ABO/RH(D): A POS
Antibody Screen: NEGATIVE

## 2020-04-08 ENCOUNTER — Other Ambulatory Visit (HOSPITAL_COMMUNITY)
Admission: RE | Admit: 2020-04-08 | Discharge: 2020-04-08 | Disposition: A | Discharge status: Home / Self Care | Payer: Managed Care, Other (non HMO) | Source: Ambulatory Visit | Attending: General Surgery | Admitting: General Surgery

## 2020-04-08 DIAGNOSIS — Z20822 Contact with and (suspected) exposure to covid-19: Secondary | ICD-10-CM | POA: Insufficient documentation

## 2020-04-08 DIAGNOSIS — Z01812 Encounter for preprocedural laboratory examination: Secondary | ICD-10-CM | POA: Diagnosis not present

## 2020-04-08 LAB — SARS CORONAVIRUS 2 (TAT 6-24 HRS): SARS Coronavirus 2: NEGATIVE

## 2020-04-12 ENCOUNTER — Inpatient Hospital Stay (HOSPITAL_COMMUNITY): Payer: Managed Care, Other (non HMO) | Admitting: Physician Assistant

## 2020-04-12 ENCOUNTER — Encounter (HOSPITAL_COMMUNITY): Payer: Self-pay | Admitting: General Surgery

## 2020-04-12 ENCOUNTER — Encounter (HOSPITAL_COMMUNITY)
Admission: RE | Disposition: A | Discharge status: Home / Self Care | Payer: Self-pay | Source: Home / Self Care | Attending: General Surgery

## 2020-04-12 ENCOUNTER — Other Ambulatory Visit: Payer: Self-pay

## 2020-04-12 ENCOUNTER — Inpatient Hospital Stay (HOSPITAL_COMMUNITY): Payer: Managed Care, Other (non HMO) | Admitting: Certified Registered Nurse Anesthetist

## 2020-04-12 ENCOUNTER — Inpatient Hospital Stay (HOSPITAL_COMMUNITY)
Admission: RE | Admit: 2020-04-12 | Discharge: 2020-04-13 | DRG: 621 | Disposition: A | Discharge status: Home / Self Care | Payer: Managed Care, Other (non HMO) | Attending: General Surgery | Admitting: General Surgery

## 2020-04-12 DIAGNOSIS — Z880 Allergy status to penicillin: Secondary | ICD-10-CM

## 2020-04-12 DIAGNOSIS — K219 Gastro-esophageal reflux disease without esophagitis: Secondary | ICD-10-CM | POA: Diagnosis present

## 2020-04-12 DIAGNOSIS — I10 Essential (primary) hypertension: Secondary | ICD-10-CM | POA: Diagnosis present

## 2020-04-12 DIAGNOSIS — Z87891 Personal history of nicotine dependence: Secondary | ICD-10-CM

## 2020-04-12 DIAGNOSIS — G4700 Insomnia, unspecified: Secondary | ICD-10-CM | POA: Diagnosis present

## 2020-04-12 DIAGNOSIS — F329 Major depressive disorder, single episode, unspecified: Secondary | ICD-10-CM | POA: Diagnosis present

## 2020-04-12 DIAGNOSIS — F419 Anxiety disorder, unspecified: Secondary | ICD-10-CM | POA: Diagnosis present

## 2020-04-12 DIAGNOSIS — Z79899 Other long term (current) drug therapy: Secondary | ICD-10-CM | POA: Diagnosis not present

## 2020-04-12 DIAGNOSIS — Z8616 Personal history of COVID-19: Secondary | ICD-10-CM | POA: Diagnosis not present

## 2020-04-12 DIAGNOSIS — Z6841 Body Mass Index (BMI) 40.0 and over, adult: Secondary | ICD-10-CM

## 2020-04-12 HISTORY — PX: ESOPHAGOGASTRODUODENOSCOPY: SHX5428

## 2020-04-12 HISTORY — PX: LAPAROSCOPIC GASTRIC SLEEVE RESECTION: SHX5895

## 2020-04-12 LAB — COMPREHENSIVE METABOLIC PANEL
ALT: 51 U/L — ABNORMAL HIGH (ref 0–44)
AST: 27 U/L (ref 15–41)
Albumin: 4.8 g/dL (ref 3.5–5.0)
Alkaline Phosphatase: 44 U/L (ref 38–126)
Anion gap: 9 (ref 5–15)
BUN: 24 mg/dL — ABNORMAL HIGH (ref 6–20)
CO2: 24 mmol/L (ref 22–32)
Calcium: 9.3 mg/dL (ref 8.9–10.3)
Chloride: 105 mmol/L (ref 98–111)
Creatinine, Ser: 1 mg/dL (ref 0.61–1.24)
GFR calc Af Amer: >60 mL/min (ref >60)
GFR calc non Af Amer: >60 mL/min (ref >60)
Glucose, Bld: 95 mg/dL (ref 70–99)
Potassium: 4.1 mmol/L (ref 3.5–5.1)
Sodium: 138 mmol/L (ref 135–145)
Total Bilirubin: 0.6 mg/dL (ref 0.3–1.2)
Total Protein: 7.9 g/dL (ref 6.5–8.1)

## 2020-04-12 LAB — CBC WITH DIFFERENTIAL/PLATELET
Abs Immature Granulocytes: 0.02 10*3/uL (ref 0.00–0.07)
Basophils Absolute: 0 10*3/uL (ref 0.0–0.1)
Basophils Relative: 0 %
Eosinophils Absolute: 0.2 10*3/uL (ref 0.0–0.5)
Eosinophils Relative: 3 %
HCT: 45.7 % (ref 39.0–52.0)
Hemoglobin: 16.1 g/dL (ref 13.0–17.0)
Immature Granulocytes: 0 %
Lymphocytes Relative: 32 %
Lymphs Abs: 2.2 10*3/uL (ref 0.7–4.0)
MCH: 30 pg (ref 26.0–34.0)
MCHC: 35.2 g/dL (ref 30.0–36.0)
MCV: 85.3 fL (ref 80.0–100.0)
Monocytes Absolute: 0.5 10*3/uL (ref 0.1–1.0)
Monocytes Relative: 7 %
Neutro Abs: 4.1 10*3/uL (ref 1.7–7.7)
Neutrophils Relative %: 58 %
Platelets: 271 10*3/uL (ref 150–400)
RBC: 5.36 MIL/uL (ref 4.22–5.81)
RDW: 12.6 % (ref 11.5–15.5)
WBC: 7.1 10*3/uL (ref 4.0–10.5)
nRBC: 0 % (ref 0.0–0.2)

## 2020-04-12 LAB — HEMOGLOBIN AND HEMATOCRIT, BLOOD
HCT: 41.1 % (ref 39.0–52.0)
Hemoglobin: 14.2 g/dL (ref 13.0–17.0)

## 2020-04-12 LAB — ABO/RH: ABO/RH(D): A POS

## 2020-04-12 SURGERY — GASTRECTOMY, SLEEVE, LAPAROSCOPIC
Anesthesia: General

## 2020-04-12 MED ORDER — ACETAMINOPHEN 500 MG PO TABS
1000.0000 mg | ORAL_TABLET | Freq: Three times a day (TID) | ORAL | Status: DC
  Administered 2020-04-12 – 2020-04-13 (×2): 1000 mg via ORAL
  Filled 2020-04-12 (×3): qty 2

## 2020-04-12 MED ORDER — CHLORHEXIDINE GLUCONATE 0.12 % MT SOLN
15.0000 mL | Freq: Once | OROMUCOSAL | Status: AC
  Administered 2020-04-12: 15 mL via OROMUCOSAL

## 2020-04-12 MED ORDER — BUPIVACAINE HCL 0.25 % IJ SOLN
INTRAMUSCULAR | Status: DC | PRN
  Administered 2020-04-12: 30 mL

## 2020-04-12 MED ORDER — PROMETHAZINE HCL 25 MG/ML IJ SOLN: 6.2500 mg | Freq: Once | INTRAMUSCULAR | Status: AC

## 2020-04-12 MED ORDER — ACETAMINOPHEN 10 MG/ML IV SOLN: 1000.0000 mg | Freq: Once | INTRAVENOUS | Status: DC | PRN

## 2020-04-12 MED ORDER — KETAMINE HCL 10 MG/ML IJ SOLN
INTRAMUSCULAR | Status: DC | PRN
  Administered 2020-04-12: 30 mg via INTRAVENOUS

## 2020-04-12 MED ORDER — OXYCODONE HCL 5 MG/5ML PO SOLN: 5.0000 mg | Freq: Once | ORAL | Status: DC | PRN

## 2020-04-12 MED ORDER — FENTANYL CITRATE (PF) 100 MCG/2ML IJ SOLN
INTRAMUSCULAR | Status: AC
  Administered 2020-04-12: 25 ug via INTRAVENOUS
  Filled 2020-04-12: qty 2

## 2020-04-12 MED ORDER — OXYCODONE HCL 5 MG/5ML PO SOLN
5.0000 mg | Freq: Four times a day (QID) | ORAL | Status: DC | PRN
  Administered 2020-04-12 – 2020-04-13 (×2): 5 mg via ORAL
  Filled 2020-04-12 (×3): qty 5

## 2020-04-12 MED ORDER — CHLORHEXIDINE GLUCONATE CLOTH 2 % EX PADS: 6.0000 | MEDICATED_PAD | Freq: Once | CUTANEOUS | Status: DC

## 2020-04-12 MED ORDER — PHENYLEPHRINE 40 MCG/ML (10ML) SYRINGE FOR IV PUSH (FOR BLOOD PRESSURE SUPPORT)
PREFILLED_SYRINGE | INTRAVENOUS | Status: DC | PRN
  Administered 2020-04-12: 200 ug via INTRAVENOUS
  Administered 2020-04-12 (×2): 80 ug via INTRAVENOUS
  Administered 2020-04-12: 200 ug via INTRAVENOUS
  Administered 2020-04-12: 120 ug via INTRAVENOUS

## 2020-04-12 MED ORDER — SUCCINYLCHOLINE CHLORIDE 200 MG/10ML IV SOSY
PREFILLED_SYRINGE | INTRAVENOUS | Status: AC
  Filled 2020-04-12: qty 10

## 2020-04-12 MED ORDER — ROCURONIUM BROMIDE 10 MG/ML (PF) SYRINGE
PREFILLED_SYRINGE | INTRAVENOUS | Status: DC | PRN
  Administered 2020-04-12: 30 mg via INTRAVENOUS
  Administered 2020-04-12: 60 mg via INTRAVENOUS

## 2020-04-12 MED ORDER — ONDANSETRON HCL 4 MG/2ML IJ SOLN: 4.0000 mg | INTRAMUSCULAR | Status: DC | PRN

## 2020-04-12 MED ORDER — HEPARIN SODIUM (PORCINE) 5000 UNIT/ML IJ SOLN
5000.0000 [IU] | INTRAMUSCULAR | Status: AC
  Administered 2020-04-12: 5000 [IU] via SUBCUTANEOUS
  Filled 2020-04-12: qty 1

## 2020-04-12 MED ORDER — 0.9 % SODIUM CHLORIDE (POUR BTL) OPTIME
TOPICAL | Status: DC | PRN
  Administered 2020-04-12: 1000 mL

## 2020-04-12 MED ORDER — ORAL CARE MOUTH RINSE: 15.0000 mL | Freq: Once | OROMUCOSAL | Status: AC

## 2020-04-12 MED ORDER — ONDANSETRON HCL 4 MG/2ML IJ SOLN
INTRAMUSCULAR | Status: AC
  Filled 2020-04-12: qty 2

## 2020-04-12 MED ORDER — LACTATED RINGERS IR SOLN
Status: DC | PRN
  Administered 2020-04-12: 1000 mL

## 2020-04-12 MED ORDER — BUPIVACAINE LIPOSOME 1.3 % IJ SUSP
INTRAMUSCULAR | Status: DC | PRN
  Administered 2020-04-12: 20 mL

## 2020-04-12 MED ORDER — GABAPENTIN 300 MG PO CAPS
300.0000 mg | ORAL_CAPSULE | ORAL | Status: AC
  Administered 2020-04-12: 300 mg via ORAL
  Filled 2020-04-12: qty 1

## 2020-04-12 MED ORDER — SUGAMMADEX SODIUM 200 MG/2ML IV SOLN
INTRAVENOUS | Status: DC | PRN
  Administered 2020-04-12: 400 mg via INTRAVENOUS

## 2020-04-12 MED ORDER — SUCCINYLCHOLINE CHLORIDE 20 MG/ML IJ SOLN
INTRAMUSCULAR | Status: DC | PRN
  Administered 2020-04-12: 140 mg via INTRAVENOUS

## 2020-04-12 MED ORDER — FENTANYL CITRATE (PF) 100 MCG/2ML IJ SOLN
INTRAMUSCULAR | Status: DC | PRN
  Administered 2020-04-12: 50 ug via INTRAVENOUS
  Administered 2020-04-12: 100 ug via INTRAVENOUS
  Administered 2020-04-12: 25 ug via INTRAVENOUS

## 2020-04-12 MED ORDER — ENOXAPARIN SODIUM 30 MG/0.3ML ~~LOC~~ SOLN
30.0000 mg | Freq: Two times a day (BID) | SUBCUTANEOUS | Status: DC
  Administered 2020-04-12 – 2020-04-13 (×2): 30 mg via SUBCUTANEOUS
  Filled 2020-04-12 (×2): qty 0.3

## 2020-04-12 MED ORDER — GLYCOPYRROLATE PF 0.2 MG/ML IJ SOSY
PREFILLED_SYRINGE | INTRAMUSCULAR | Status: DC | PRN
  Administered 2020-04-12 (×2): .2 mg via INTRAVENOUS

## 2020-04-12 MED ORDER — FAMOTIDINE IN NACL 20-0.9 MG/50ML-% IV SOLN
20.0000 mg | Freq: Two times a day (BID) | INTRAVENOUS | Status: DC
  Administered 2020-04-12 (×2): 20 mg via INTRAVENOUS
  Filled 2020-04-12 (×3): qty 50

## 2020-04-12 MED ORDER — ACETAMINOPHEN 160 MG/5ML PO SOLN
1000.0000 mg | Freq: Three times a day (TID) | ORAL | Status: DC
  Administered 2020-04-12: 14:00:00 1000 mg via ORAL
  Filled 2020-04-12: qty 40.6

## 2020-04-12 MED ORDER — EPHEDRINE 5 MG/ML INJ
INTRAVENOUS | Status: AC
  Filled 2020-04-12: qty 20

## 2020-04-12 MED ORDER — DEXAMETHASONE SODIUM PHOSPHATE 10 MG/ML IJ SOLN
INTRAMUSCULAR | Status: DC | PRN
  Administered 2020-04-12: 10 mg via INTRAVENOUS

## 2020-04-12 MED ORDER — SIMETHICONE 80 MG PO CHEW
80.0000 mg | CHEWABLE_TABLET | Freq: Four times a day (QID) | ORAL | Status: DC | PRN
  Administered 2020-04-13: 80 mg via ORAL
  Filled 2020-04-12 (×2): qty 1

## 2020-04-12 MED ORDER — ENSURE MAX PROTEIN PO LIQD
2.0000 [oz_av] | ORAL | Status: DC
  Administered 2020-04-13: 2 [oz_av] via ORAL

## 2020-04-12 MED ORDER — ACETAMINOPHEN 500 MG PO TABS: 1000.0000 mg | ORAL_TABLET | Freq: Once | ORAL | Status: DC | PRN

## 2020-04-12 MED ORDER — MORPHINE SULFATE (PF) 2 MG/ML IV SOLN: 1.0000 mg | INTRAVENOUS | Status: DC | PRN

## 2020-04-12 MED ORDER — ONDANSETRON HCL 4 MG/2ML IJ SOLN
INTRAMUSCULAR | Status: DC | PRN
  Administered 2020-04-12: 4 mg via INTRAVENOUS

## 2020-04-12 MED ORDER — PROPOFOL 10 MG/ML IV BOLUS
INTRAVENOUS | Status: AC
  Filled 2020-04-12: qty 20

## 2020-04-12 MED ORDER — DEXTROSE-NACL 5-0.45 % IV SOLN: INTRAVENOUS | Status: DC

## 2020-04-12 MED ORDER — ROCURONIUM BROMIDE 10 MG/ML (PF) SYRINGE
PREFILLED_SYRINGE | INTRAVENOUS | Status: AC
  Filled 2020-04-12: qty 20

## 2020-04-12 MED ORDER — LACTATED RINGERS IV SOLN: INTRAVENOUS | Status: DC

## 2020-04-12 MED ORDER — ACETAMINOPHEN 160 MG/5ML PO SOLN: 1000.0000 mg | Freq: Once | ORAL | Status: DC | PRN

## 2020-04-12 MED ORDER — PROPOFOL 10 MG/ML IV BOLUS
INTRAVENOUS | Status: DC | PRN
  Administered 2020-04-12: 200 mg via INTRAVENOUS

## 2020-04-12 MED ORDER — MIDAZOLAM HCL 2 MG/2ML IJ SOLN
INTRAMUSCULAR | Status: AC
  Filled 2020-04-12: qty 2

## 2020-04-12 MED ORDER — STERILE WATER FOR IRRIGATION IR SOLN
Status: DC | PRN
  Administered 2020-04-12: 1000 mL

## 2020-04-12 MED ORDER — HYDRALAZINE HCL 25 MG PO TABS: 25.0000 mg | ORAL_TABLET | Freq: Three times a day (TID) | ORAL | Status: DC | PRN

## 2020-04-12 MED ORDER — FENTANYL CITRATE (PF) 250 MCG/5ML IJ SOLN
INTRAMUSCULAR | Status: AC
  Filled 2020-04-12: qty 5

## 2020-04-12 MED ORDER — BUPIVACAINE LIPOSOME 1.3 % IJ SUSP
20.0000 mL | Freq: Once | INTRAMUSCULAR | Status: DC
  Filled 2020-04-12: qty 20

## 2020-04-12 MED ORDER — KETAMINE HCL 10 MG/ML IJ SOLN
INTRAMUSCULAR | Status: AC
  Filled 2020-04-12: qty 1

## 2020-04-12 MED ORDER — APREPITANT 40 MG PO CAPS
40.0000 mg | ORAL_CAPSULE | ORAL | Status: AC
  Administered 2020-04-12: 40 mg via ORAL
  Filled 2020-04-12: qty 1

## 2020-04-12 MED ORDER — LOSARTAN POTASSIUM 50 MG PO TABS
50.0000 mg | ORAL_TABLET | Freq: Every day | ORAL | Status: DC
  Filled 2020-04-12: qty 1

## 2020-04-12 MED ORDER — EPHEDRINE SULFATE-NACL 50-0.9 MG/10ML-% IV SOSY
PREFILLED_SYRINGE | INTRAVENOUS | Status: DC | PRN
  Administered 2020-04-12: 10 mg via INTRAVENOUS
  Administered 2020-04-12: 15 mg via INTRAVENOUS
  Administered 2020-04-12: 30 mg via INTRAVENOUS
  Administered 2020-04-12: 20 mg via INTRAVENOUS

## 2020-04-12 MED ORDER — OXYCODONE HCL 5 MG PO TABS: 5.0000 mg | ORAL_TABLET | Freq: Once | ORAL | Status: DC | PRN

## 2020-04-12 MED ORDER — DEXAMETHASONE SODIUM PHOSPHATE 4 MG/ML IJ SOLN: 4.0000 mg | INTRAMUSCULAR | Status: DC

## 2020-04-12 MED ORDER — LIDOCAINE 2% (20 MG/ML) 5 ML SYRINGE
INTRAMUSCULAR | Status: DC | PRN
  Administered 2020-04-12: 3.8 mL/h via INTRAVENOUS

## 2020-04-12 MED ORDER — MIDAZOLAM HCL 5 MG/5ML IJ SOLN
INTRAMUSCULAR | Status: DC | PRN
  Administered 2020-04-12: 2 mg via INTRAVENOUS

## 2020-04-12 MED ORDER — ACETAMINOPHEN 500 MG PO TABS
1000.0000 mg | ORAL_TABLET | ORAL | Status: AC
  Administered 2020-04-12: 1000 mg via ORAL
  Filled 2020-04-12: qty 2

## 2020-04-12 MED ORDER — BUPIVACAINE HCL 0.25 % IJ SOLN
INTRAMUSCULAR | Status: AC
  Filled 2020-04-12: qty 1

## 2020-04-12 MED ORDER — SCOPOLAMINE 1 MG/3DAYS TD PT72
1.0000 | MEDICATED_PATCH | TRANSDERMAL | Status: DC
  Administered 2020-04-12: 1.5 mg via TRANSDERMAL
  Filled 2020-04-12: qty 1

## 2020-04-12 MED ORDER — FENTANYL CITRATE (PF) 100 MCG/2ML IJ SOLN
25.0000 ug | INTRAMUSCULAR | Status: DC | PRN
  Administered 2020-04-12: 25 ug via INTRAVENOUS
  Administered 2020-04-12: 50 ug via INTRAVENOUS

## 2020-04-12 MED ORDER — SODIUM CHLORIDE 0.9 % IV SOLN
2.0000 g | INTRAVENOUS | Status: AC
  Administered 2020-04-12: 2 g via INTRAVENOUS
  Filled 2020-04-12: qty 2

## 2020-04-12 MED ORDER — LIDOCAINE 2% (20 MG/ML) 5 ML SYRINGE
INTRAMUSCULAR | Status: DC | PRN
  Administered 2020-04-12: 80 mg via INTRAVENOUS

## 2020-04-12 MED ORDER — PHENYLEPHRINE 40 MCG/ML (10ML) SYRINGE FOR IV PUSH (FOR BLOOD PRESSURE SUPPORT)
PREFILLED_SYRINGE | INTRAVENOUS | Status: AC
  Filled 2020-04-12: qty 80

## 2020-04-12 MED ORDER — PROMETHAZINE HCL 25 MG/ML IJ SOLN
INTRAMUSCULAR | Status: AC
  Administered 2020-04-12: 6.25 mg via INTRAVENOUS
  Filled 2020-04-12: qty 1

## 2020-04-12 SURGICAL SUPPLY — 59 items
APPLIER CLIP ROT 13.4 12 LRG (CLIP)
BAG LAPAROSCOPIC 12 15 PORT 16 (BASKET) ×1 IMPLANT
BAG RETRIEVAL 12/15 (BASKET) ×2
BAG RETRIEVAL 12/15MM (BASKET) ×1
BENZOIN TINCTURE PRP APPL 2/3 (GAUZE/BANDAGES/DRESSINGS) ×3 IMPLANT
BLADE SURG SZ11 CARB STEEL (BLADE) ×3 IMPLANT
BNDG ADH 1X3 SHEER STRL LF (GAUZE/BANDAGES/DRESSINGS) ×18 IMPLANT
CABLE HIGH FREQUENCY MONO STRZ (ELECTRODE) IMPLANT
CHLORAPREP W/TINT 26 (MISCELLANEOUS) ×3 IMPLANT
CLIP APPLIE ROT 13.4 12 LRG (CLIP) IMPLANT
CLOSURE WOUND 1/2 X4 (GAUZE/BANDAGES/DRESSINGS) ×1
COVER SURGICAL LIGHT HANDLE (MISCELLANEOUS) ×3 IMPLANT
COVER WAND RF STERILE (DRAPES) IMPLANT
DECANTER SPIKE VIAL GLASS SM (MISCELLANEOUS) ×3 IMPLANT
DERMABOND ADVANCED (GAUZE/BANDAGES/DRESSINGS) ×2
DERMABOND ADVANCED .7 DNX12 (GAUZE/BANDAGES/DRESSINGS) ×1 IMPLANT
DRAPE UTILITY XL STRL (DRAPES) ×6 IMPLANT
ELECT REM PT RETURN 15FT ADLT (MISCELLANEOUS) ×3 IMPLANT
GAUZE 4X4 16PLY RFD (DISPOSABLE) ×3 IMPLANT
GLOVE BIOGEL PI IND STRL 7.0 (GLOVE) ×1 IMPLANT
GLOVE BIOGEL PI INDICATOR 7.0 (GLOVE) ×2
GLOVE SURG SS PI 7.0 STRL IVOR (GLOVE) ×3 IMPLANT
GOWN STRL REUS W/TWL LRG LVL3 (GOWN DISPOSABLE) ×3 IMPLANT
GOWN STRL REUS W/TWL XL LVL3 (GOWN DISPOSABLE) ×9 IMPLANT
GRASPER SUT TROCAR 14GX15 (MISCELLANEOUS) ×3 IMPLANT
HOVERMATT SINGLE USE (MISCELLANEOUS) IMPLANT
KIT BASIN OR (CUSTOM PROCEDURE TRAY) ×3 IMPLANT
KIT TURNOVER KIT A (KITS) IMPLANT
MARKER SKIN DUAL TIP RULER LAB (MISCELLANEOUS) ×3 IMPLANT
NEEDLE SPNL 22GX3.5 QUINCKE BK (NEEDLE) ×3 IMPLANT
PACK UNIVERSAL I (CUSTOM PROCEDURE TRAY) ×3 IMPLANT
RELOAD STAPLER BLUE 60MM (STAPLE) ×2 IMPLANT
RELOAD STAPLER GOLD 60MM (STAPLE) ×1 IMPLANT
RELOAD STAPLER GREEN 60MM (STAPLE) ×1 IMPLANT
SCISSORS LAP 5X45 EPIX DISP (ENDOMECHANICALS) IMPLANT
SET IRRIG TUBING LAPAROSCOPIC (IRRIGATION / IRRIGATOR) ×3 IMPLANT
SET TUBE SMOKE EVAC HIGH FLOW (TUBING) ×3 IMPLANT
SHEARS HARMONIC ACE PLUS 45CM (MISCELLANEOUS) ×3 IMPLANT
SLEEVE GASTRECTOMY 40FR VISIGI (MISCELLANEOUS) ×3 IMPLANT
SLEEVE XCEL OPT CAN 5 100 (ENDOMECHANICALS) ×9 IMPLANT
SOL ANTI FOG 6CC (MISCELLANEOUS) ×1 IMPLANT
SOLUTION ANTI FOG 6CC (MISCELLANEOUS) ×2
STAPLER ECHELON LONG 60 440 (INSTRUMENTS) ×3 IMPLANT
STAPLER RELOAD BLUE 60MM (STAPLE) ×6
STAPLER RELOAD GOLD 60MM (STAPLE) ×3
STAPLER RELOAD GREEN 60MM (STAPLE) ×3
STRIP CLOSURE SKIN 1/2X4 (GAUZE/BANDAGES/DRESSINGS) ×2 IMPLANT
SUT ETHIBOND 0 36 GRN (SUTURE) IMPLANT
SUT MNCRL AB 4-0 PS2 18 (SUTURE) ×3 IMPLANT
SUT VICRYL 0 TIES 12 18 (SUTURE) ×3 IMPLANT
SYR 20ML LL LF (SYRINGE) ×3 IMPLANT
SYR 50ML LL SCALE MARK (SYRINGE) ×3 IMPLANT
TAPE STRIPS DRAPE STRL (GAUZE/BANDAGES/DRESSINGS) ×3 IMPLANT
TOWEL OR 17X26 10 PK STRL BLUE (TOWEL DISPOSABLE) ×3 IMPLANT
TOWEL OR NON WOVEN STRL DISP B (DISPOSABLE) ×3 IMPLANT
TROCAR BLADELESS 15MM (ENDOMECHANICALS) ×3 IMPLANT
TROCAR BLADELESS OPT 5 100 (ENDOMECHANICALS) ×3 IMPLANT
TUBING CONNECTING 10 (TUBING) ×4 IMPLANT
TUBING CONNECTING 10' (TUBING) ×2

## 2020-04-12 NOTE — Anesthesia Preprocedure Evaluation (Addendum)
Anesthesia Evaluation  Patient identified by MRN, date of birth, ID band Patient awake    Reviewed: Allergy & Precautions, NPO status , Patient's Chart, lab work & pertinent test results  History of Anesthesia Complications Negative for: history of anesthetic complications  Airway Mallampati: I  TM Distance: >3 FB Neck ROM: Full    Dental  (+) Dental Advisory Given, Teeth Intact   Pulmonary sleep apnea , neg COPD, neg recent URI, former smoker,    breath sounds clear to auscultation       Cardiovascular hypertension, Pt. on medications (-) angina(-) Past MI and (-) CHF  Rhythm:Regular     Neuro/Psych neg Seizures PSYCHIATRIC DISORDERS Anxiety Depression negative neurological ROS     GI/Hepatic Neg liver ROS, GERD  ,  Endo/Other  negative endocrine ROS  Renal/GU negative Renal ROS     Musculoskeletal negative musculoskeletal ROS (+)   Abdominal   Peds  Hematology negative hematology ROS (+)   Anesthesia Other Findings   Reproductive/Obstetrics                            Anesthesia Physical Anesthesia Plan  ASA: II  Anesthesia Plan: General   Post-op Pain Management:    Induction: Intravenous  PONV Risk Score and Plan: 2 and Ondansetron and Dexamethasone  Airway Management Planned: Oral ETT  Additional Equipment: None  Intra-op Plan:   Post-operative Plan: Extubation in OR  Informed Consent: I have reviewed the patients History and Physical, chart, labs and discussed the procedure including the risks, benefits and alternatives for the proposed anesthesia with the patient or authorized representative who has indicated his/her understanding and acceptance.     Dental advisory given  Plan Discussed with: CRNA and Surgeon  Anesthesia Plan Comments:         Anesthesia Quick Evaluation

## 2020-04-12 NOTE — Op Note (Signed)
Name:  Jeffrey Keller MRN: [id number] Date of Surgery: 04/12/2020  Preop Diagnosis:  Morbid Obesity  Postop Diagnosis:  Morbid Obesity (Weight - 137 kg, BMI - 41), S/P Gastric Sleeve resection  Procedure:  Upper endoscopy  (Intraoperative)  Surgeon:  Ovidio Kin, M.D.  Anesthesia:  GET  Indications for procedure: Jeffrey Keller is a 34 y.o. male whose primary care physician is Jerl Mina, MD and has completed a gastric sleeve resection today for weight loss by Dr. Sheliah Hatch.  I am doing an intraoperative upper endoscopy to evaluate the gastric pouch after the sleeve gastrectomy.  Operative Note: The patient is under general anesthesia.  Dr. Sheliah Hatch is laparoscoping the patient while I do an upper endoscopy to evaluate the stomach pouch.  With the patient intubated, I passed the Olympus upper endoscope without difficulty down the esophagus.  The esophagus was unremarkable.  The esophago-gastric junction was at 41 cm.    The mucosa of the stomach looked viable and the staple line was intact without bleeding.  I advanced the scope to the pylorus, but did not go through it.  While I insufflated the stomach pouch with air, Dr. Sheliah Hatch  flooded the upper abdomen with saline to put the gastric pouch under saline.  There was no bubbling or evidence of a leak.  There was no evidence of narrowing of the pouch and the gastric sleeve looked tubular.  The scope was then withdrawn.  The esophagus was unremarkable and the patient tolerated the endoscopy without difficulty.  Ovidio Kin, MD, [facility] Surgery Office phone:  [phone]

## 2020-04-12 NOTE — Progress Notes (Signed)
PHARMACY CONSULT FOR:  Risk Assessment for Post-Discharge VTE Following Bariatric Surgery  Post-Discharge VTE Risk Assessment: This patient's probability of 30-day post-discharge VTE is increased due to the factors marked:   Male    Age >/=60 years    BMI >/=50 kg/m2    CHF    Dyspnea at Rest    Paraplegia  x  Non-gastric-band surgery    Operation Time >/=3 hr    Return to OR     Length of Stay >/= 3 d   Hx of VTE   Hypercoagulable condition   Significant venous stasis       Predicted probability of 30-day post-discharge VTE: 0.31%  Other patient-specific factors to consider: n/a   Recommendation for Discharge: No pharmacologic prophylaxis post-discharge    Jeffrey Keller is a 34 y.o. male who underwent  laparoscopic sleeve gastrectomy  on 04/12/20   Case start: 0758 Case end: 0858   Allergies  Allergen Reactions  . Penicillins Other (See Comments)    Unknown childhood reaction Has patient had a PCN reaction causing immediate rash, facial/tongue/throat swelling, SOB or lightheadedness with hypotension: Unknown Has patient had a PCN reaction causing severe rash involving mucus membranes or skin necrosis: Unknown Has patient had a PCN reaction that required hospitalization: Unknown Has patient had a PCN reaction occurring within the last 10 years: Unknown If all of the above answers are "NO", then may proceed with Cephalosporin use.     Patient Measurements: Height: 6' (182.9 cm) Weight: (!) 137.2 kg (302 lb 6.4 oz) IBW/kg (Calculated) : 77.6 Body mass index is 41.01 kg/m.  Recent Labs    04/12/20 0545 04/12/20 1037  WBC 7.1  --   HGB 16.1 14.2  HCT 45.7 41.1  PLT 271  --   CREATININE 1.00  --   ALBUMIN 4.8  --   PROT 7.9  --   AST 27  --   ALT 51*  --   ALKPHOS 44  --   BILITOT 0.6  --    Estimated Creatinine Clearance: 147.9 mL/min (by C-G formula based on SCr of 1 mg/dL).    Past Medical History:  Diagnosis Date  . Anxiety   . COVID-19  11/2019  . Depression   . Diverticulitis   . GERD (gastroesophageal reflux disease)   . Hypertension   . Insomnia   . Obesity   . Pneumonia    Covid pneumonia  . Sleep apnea      Medications Prior to Admission  Medication Sig Dispense Refill Last Dose  . losartan (COZAAR) 50 MG tablet Take 50 mg by mouth daily.   04/12/2020 at 0530  . venlafaxine XR (EFFEXOR-XR) 150 MG 24 hr capsule Take 150 mg by mouth daily.   04/12/2020 at 0530       Berkley Harvey 04/12/2020,11:47 AM

## 2020-04-12 NOTE — Transfer of Care (Signed)
Immediate Anesthesia Transfer of Care Note  Patient: Jeffrey Keller  Procedure(s) Performed: LAPAROSCOPIC SLEEVE GASTRECTOMY (N/A ) UPPER GASTROINTESTINAL ENDOSCOPY (N/A )  Patient Location: PACU  Anesthesia Type:General  Level of Consciousness: sedated, patient cooperative and responds to stimulation  Airway & Oxygen Therapy: Patient Spontanous Breathing and Patient connected to face mask oxygen  Post-op Assessment: Report given to RN and Post -op Vital signs reviewed and stable  Post vital signs: Reviewed and stable  Last Vitals:  Vitals Value Taken Time  BP 136/82 04/12/20 0911  Temp    Pulse 104 04/12/20 0913  Resp    SpO2 99 % 04/12/20 0913  Vitals shown include unvalidated device data.  Last Pain:  Vitals:   04/12/20 0545  TempSrc: Oral  PainSc:       Patients Stated Pain Goal: 4 (04/12/20 0539)  Complications: No complications documented.

## 2020-04-12 NOTE — H&P (Signed)
Jeffrey Keller is an 34 y.o. male.   Chief Complaint: obesity HPI: 34 yo male with long history of obesity and hypertension and OSA. He has completed all requirements and is ready to proceed with bariatric surgery  Past Medical History:  Diagnosis Date  . Anxiety   . COVID-19 11/2019  . Depression   . Diverticulitis   . GERD (gastroesophageal reflux disease)   . Hypertension   . Insomnia   . Obesity   . Pneumonia    Covid pneumonia  . Sleep apnea     Past Surgical History:  Procedure Laterality Date  . APPENDECTOMY    . COLONOSCOPY    . VASECTOMY    . WISDOM TOOTH EXTRACTION      History reviewed. No pertinent family history. Social History:  reports that he has quit smoking. He smoked 0.50 packs per day. He has never used smokeless tobacco. He reports current alcohol use. He reports that he does not use drugs.  Allergies:  Allergies  Allergen Reactions  . Penicillins Other (See Comments)    Unknown childhood reaction Has patient had a PCN reaction causing immediate rash, facial/tongue/throat swelling, SOB or lightheadedness with hypotension: Unknown Has patient had a PCN reaction causing severe rash involving mucus membranes or skin necrosis: Unknown Has patient had a PCN reaction that required hospitalization: Unknown Has patient had a PCN reaction occurring within the last 10 years: Unknown If all of the above answers are "NO", then may proceed with Cephalosporin use.     Medications Prior to Admission  Medication Sig Dispense Refill  . losartan (COZAAR) 50 MG tablet Take 50 mg by mouth daily.    Marland Kitchen venlafaxine XR (EFFEXOR-XR) 150 MG 24 hr capsule Take 150 mg by mouth daily.      Results for orders placed or performed during the hospital encounter of 04/12/20 (from the past 48 hour(s))  CBC WITH DIFFERENTIAL     Status: None   Collection Time: 04/12/20  5:45 AM  Result Value Ref Range   WBC 7.1 4.0 - 10.5 K/uL   RBC 5.36 4.22 - 5.81 MIL/uL   Hemoglobin 16.1  13.0 - 17.0 g/dL   HCT 47.4 39 - 52 %   MCV 85.3 80.0 - 100.0 fL   MCH 30.0 26.0 - 34.0 pg   MCHC 35.2 30.0 - 36.0 g/dL   RDW 25.9 56.3 - 87.5 %   Platelets 271 150 - 400 K/uL   nRBC 0.0 0.0 - 0.2 %   Neutrophils Relative % 58 %   Neutro Abs 4.1 1.7 - 7.7 K/uL   Lymphocytes Relative 32 %   Lymphs Abs 2.2 0.7 - 4.0 K/uL   Monocytes Relative 7 %   Monocytes Absolute 0.5 0 - 1 K/uL   Eosinophils Relative 3 %   Eosinophils Absolute 0.2 0 - 0 K/uL   Basophils Relative 0 %   Basophils Absolute 0.0 0 - 0 K/uL   Immature Granulocytes 0 %   Abs Immature Granulocytes 0.02 0.00 - 0.07 K/uL    Comment: Performed at [facility], [address], [place], [place] [address]  Comprehensive metabolic panel     Status: Abnormal   Collection Time: 04/12/20  5:45 AM  Result Value Ref Range   Sodium 138 135 - 145 mmol/L   Potassium 4.1 3.5 - 5.1 mmol/L   Chloride 105 98 - 111 mmol/L   CO2 24 22 - 32 mmol/L   Glucose, Bld 95 70 - 99 mg/dL  Comment: Glucose reference range applies only to samples taken after fasting for at least 8 hours.   BUN 24 (H) 6 - 20 mg/dL   Creatinine, Ser 5.39 0.61 - 1.24 mg/dL   Calcium 9.3 8.9 - 76.7 mg/dL   Total Protein 7.9 6.5 - 8.1 g/dL   Albumin 4.8 3.5 - 5.0 g/dL   AST 27 15 - 41 U/L   ALT 51 (H) 0 - 44 U/L   Alkaline Phosphatase 44 38 - 126 U/L   Total Bilirubin 0.6 0.3 - 1.2 mg/dL   GFR calc non Af Amer >60 >60 mL/min   GFR calc Af Amer >60 >60 mL/min   Anion gap 9 5 - 15    Comment: Performed at [facility], [address], [place], [place] [address]  ABO/Rh     Status: None   Collection Time: 04/12/20  5:45 AM  Result Value Ref Range   ABO/RH(D)      A POS Performed at [facility], [address] [place], [place] [address]    No results found.  Review of Systems  Constitutional: Negative for chills and fever.  HENT: Negative for hearing loss.   Respiratory: Negative for cough.    Cardiovascular: Negative for chest pain and palpitations.  Gastrointestinal: Negative for abdominal pain, nausea and vomiting.  Genitourinary: Negative for dysuria and urgency.  Musculoskeletal: Negative for myalgias and neck pain.  Skin: Negative for rash.  Neurological: Negative for dizziness and headaches.  Hematological: Does not bruise/bleed easily.  Psychiatric/Behavioral: Negative for suicidal ideas.    Blood pressure (!) 155/96, pulse 73, temperature 98.1 F (36.7 C), temperature source Oral, resp. rate 18, height 6' (1.829 m), weight (!) 137.2 kg, SpO2 98 %. Physical Exam Vitals reviewed.  Constitutional:      Appearance: He is well-developed.  HENT:     Head: Normocephalic and atraumatic.  Eyes:     Conjunctiva/sclera: Conjunctivae normal.     Pupils: Pupils are equal, round, and reactive to light.  Cardiovascular:     Rate and Rhythm: Normal rate and regular rhythm.  Pulmonary:     Effort: Pulmonary effort is normal.     Breath sounds: Normal breath sounds.  Abdominal:     General: Bowel sounds are normal. There is no distension.     Palpations: Abdomen is soft.     Tenderness: There is no abdominal tenderness.  Musculoskeletal:        General: Normal range of motion.     Cervical back: Normal range of motion and neck supple.  Skin:    General: Skin is warm and dry.  Neurological:     Mental Status: He is alert and oriented to person, place, and time.  Psychiatric:        Behavior: Behavior normal.      Assessment/Plan 34 yo male with class III obesity -lap sleeve gastrectomy -ERAS and bariatric protocols -inpatient admission  Rodman Pickle, MD 04/12/2020, 7:06 AM

## 2020-04-12 NOTE — Progress Notes (Signed)
Discussed post op day goals with patient including ambulation, IS, diet progression, pain, and nausea control.  BSTOP education provided including BSTOP information guide, "Guide for Pain Management after your Bariatric Procedure".  Questions answered. 

## 2020-04-12 NOTE — Discharge Instructions (Signed)
   GASTRIC BYPASS/SLEEVE  Home Care Instructions   These instructions are to help you care for yourself when you go home.  Call: If you have any problems. . Call [phone] and ask for the surgeon on call . If you need immediate help, come to the ER at [facility].  . Tell the ER staff that you are a new post-op gastric bypass or gastric sleeve patient   Signs and symptoms to report: . Severe vomiting or nausea o If you cannot keep down clear liquids for longer than 1 day, call your surgeon  . Abdominal pain that does not get better after taking your pain medication . Fever over 100.4 F with chills . Heart beating over 100 beats a minute . Shortness of breath at rest . Chest pain .  Redness, swelling, drainage, or foul odor at incision (surgical) sites .  If your incisions open or pull apart . Swelling or pain in calf (lower leg) . Diarrhea (Loose bowel movements that happen often), frequent watery, uncontrolled bowel movements . Constipation, (no bowel movements for 3 days) if this happens: Pick one o Milk of Magnesia, 2 tablespoons by mouth, 3 times a day for 2 days if needed o Stop taking Milk of Magnesia once you have a bowel movement o Call your doctor if constipation continues Or o Miralax  (instead of Milk of Magnesia) following the label instructions o Stop taking Miralax once you have a bowel movement o Call your doctor if constipation continues . Anything you think is not normal   Normal side effects after surgery: . Unable to sleep at night or unable to focus . Irritability or moody . Being tearful (crying) or depressed These are common complaints, possibly related to your anesthesia medications that put you to sleep, stress of surgery, and change in lifestyle.  This usually goes away a few weeks after surgery.  If these feelings continue, call your primary care doctor.   Wound Care: You may have surgical glue, steri-strips, or staples over your incisions after  surgery . Surgical glue:  Looks like a clear film over your incisions and will wear off a little at a time . Steri-strips: Strips of tape over your incisions. You may notice a yellowish color on the skin under the steri-strips. This is used to make the   steri-strips stick better. Do not pull the steri-strips off - let them fall off . Staples: Staples may be removed before you leave the hospital o If you go home with staples, call [facility], [phone] at for an appointment with your surgeon's nurse to have staples removed 10 days after surgery. . Showering: You may shower two (2) days after your surgery unless your surgeon tells you differently o Wash gently around incisions with warm soapy water, rinse well, and gently pat dry  o No tub baths until staples are removed, steri-strips fall off or glue is gone.    Medications: . Medications should be liquid or crushed if larger than the size of a dime . Extended release pills (medication that release a little bit at a time through the day) should NOT be crushed or cut. (examples include XL, ER, DR, SR) . Depending on the size and number of medications you take, you may need to space (take a few throughout the day)/change the time you take your medications so that you do not over-fill your pouch (smaller stomach) . Make sure you follow-up with your primary care doctor to   make medication changes needed during rapid weight loss and life-style changes . If you have diabetes, follow up with the doctor that orders your diabetes medication(s) within one week after surgery and check your blood sugar regularly. . Do not drive while taking prescription pain medication  . It is ok to take Tylenol by the bottle instructions with your pain medicine or instead of your pain medicine as needed.  DO NOT TAKE NSAIDS (EXAMPLES OF NSAIDS:  IBUPROFREN/ NAPROXEN)  Diet:                    First 2 Weeks  You will see the dietician t about two (2) weeks  after your surgery. The dietician will increase the types of foods you can eat if you are handling liquids well: . If you have severe vomiting or nausea and cannot keep down clear liquids lasting longer than 1 day, call your surgeon @ ([phone]) Protein Shake . Drink at least 2 ounces of shake 5-6 times per day . Each serving of protein shakes (usually 8 - 12 ounces) should have: o 15 grams of protein  o And no more than 5 grams of carbohydrate  . Goal for protein each day: o Men = 80 grams per day o Women = 60 grams per day . Protein powder may be added to fluids such as non-fat milk or Lactaid milk or unsweetened Soy/Almond milk (limit to 35 grams added protein powder per serving)  Hydration . Slowly increase the amount of water and other clear liquids as tolerated (See Acceptable Fluids) . Slowly increase the amount of protein shake as tolerated  .  Sip fluids slowly and throughout the day.  Do not use straws. . May use sugar substitutes in small amounts (no more than 6 - 8 packets per day; i.e. Splenda)  Fluid Goal . The first goal is to drink at least 8 ounces of protein shake/drink per day (or as directed by the nutritionist); some examples of protein shakes are Syntrax Nectar, Adkins Advantage, EAS Edge HP, and Unjury. See handout from pre-op Bariatric Education Class: o Slowly increase the amount of protein shake you drink as tolerated o You may find it easier to slowly sip shakes throughout the day o It is important to get your proteins in first . Your fluid goal is to drink 64 - 100 ounces of fluid daily o It may take a few weeks to build up to this . 32 oz (or more) should be clear liquids  And  . 32 oz (or more) should be full liquids (see below for examples) . Liquids should not contain sugar, caffeine, or carbonation  Clear Liquids: . Water or Sugar-free flavored water (i.e. Fruit H2O, Propel) . Decaffeinated coffee or tea (sugar-free) . Crystal Lite, Wyler's Lite,  Minute Maid Lite . Sugar-free Jell-O . Bouillon or broth . Sugar-free Popsicle:   *Less than 20 calories each; Limit 1 per day  Full Liquids: Protein Shakes/Drinks + 2 choices per day of other full liquids . Full liquids must be: o No More Than 15 grams of Carbs per serving  o No More Than 3 grams of Fat per serving . Strained low-fat cream soup (except Cream of Potato or Tomato) . Non-Fat milk . Fat-free Lactaid Milk . Unsweetened Soy Or Unsweetened Almond Milk . Low Sugar yogurt (Dannon Lite & Fit, Greek yogurt; Oikos Triple Zero; Chobani Simply 100; Yoplait 100 calorie Greek - No Fruit on the Bottom)    Vitamins   and Minerals . Start 1 day after surgery unless otherwise directed by your surgeon . Chewable Bariatric Specific Multivitamin / Multimineral Supplement with iron (Example: Bariatric Advantage Multi EA) . Chewable Calcium with Vitamin D-3 (Example: 3 Chewable Calcium Plus 600 with Vitamin D-3) o Take 500 mg three (3) times a day for a total of 1500 mg each day o Do not take all 3 doses of calcium at one time as it may cause constipation, and you can only absorb 500 mg  at a time  o Do not mix multivitamins containing iron with calcium supplements; take 2 hours apart . Menstruating women and those with a history of anemia (a blood disease that causes weakness) may need extra iron o Talk with your doctor to see if you need more iron . Do not stop taking or change any vitamins or minerals until you talk to your dietitian or surgeon . Your Dietitian and/or surgeon must approve all vitamin and mineral supplements   Activity and Exercise: Limit your physical activity as instructed by your doctor.  It is important to continue walking at home.  During this time, use these guidelines: . Do not lift anything greater than ten (10) pounds for at least two (2) weeks . Do not go back to work or drive until your surgeon says you can . You may have sex when you feel comfortable  o It is  VERY important for male patients to use a reliable birth control method; fertility often increases after surgery  o All hormonal birth control will be ineffective for 30 days after surgery due to medications given during surgery a barrier method must be used. o Do not get pregnant for at least 18 months . Start exercising as soon as your doctor tells you that you can o Make sure your doctor approves any physical activity . Start with a simple walking program . Walk 5-15 minutes each day, 7 days per week.  . Slowly increase until you are walking 30-45 minutes per day Consider joining our BELT program. [phone] or email [email or web address]   Special Instructions Things to remember: . Use your CPAP when sleeping if this applies to you  . [facility] has two free Bariatric Surgery Support Groups that meet monthly o The 3rd Thursday of each month, 6 pm o The 2nd Friday of each month, 11:30 . It is very important to keep all follow up appointments with your surgeon, dietitian, primary care physician, and behavioral health practitioner . Routine follow up schedule with your surgeon include appointments at 2-3 weeks, 6-8 weeks, 6 months, and 1 year at a minimum.  Your surgeon may request to see you more often. . After the first year, please follow up with your bariatric surgeon and dietitian at least once a year in order to maintain best weight loss results   [facility]: [phone] [facility]: [phone] Bariatric Nurse Coordinator: [phone]      Reviewed and Endorsed  by [facility] Patient Education Committee, June, 2016 Edits Approved: Aug, 2018    

## 2020-04-12 NOTE — Anesthesia Procedure Notes (Signed)
Procedure Name: Intubation Performed by: Cortny Bambach J, CRNA Pre-anesthesia Checklist: Patient identified, Emergency Drugs available, Suction available, Patient being monitored and Timeout performed Patient Re-evaluated:Patient Re-evaluated prior to induction Oxygen Delivery Method: Circle system utilized Preoxygenation: Pre-oxygenation with 100% oxygen Induction Type: IV induction Ventilation: Mask ventilation without difficulty Laryngoscope Size: Mac and 4 Grade View: Grade II Tube type: Oral Tube size: 7.5 mm Number of attempts: 1 Airway Equipment and Method: Stylet Placement Confirmation: ETT inserted through vocal cords under direct vision,  positive ETCO2 and breath sounds checked- equal and bilateral Secured at: 23 cm Tube secured with: Tape Dental Injury: Teeth and Oropharynx as per pre-operative assessment        

## 2020-04-12 NOTE — Op Note (Signed)
Preop Diagnosis: Obesity Class III  Postop Diagnosis: same  Procedure performed: laparoscopic Sleeve Gastrectomy  Assitant: Ovidio Kin  Indications:  The patient is a 34 y.o. year-old morbidly obese male who has been followed in the Bariatric Clinic as an outpatient. This patient was diagnosed with morbid obesity with a BMI of Body mass index is 41.01 kg/m. and significant co-morbidities including hypertension.  The patient was counseled extensively in the Bariatric Outpatient Clinic and after a thorough explanation of the risks and benefits of surgery (including death from complications, bowel leak, infection such as peritonitis and/or sepsis, internal hernia, bleeding, need for blood transfusion, bowel obstruction, organ failure, pulmonary embolus, deep venous thrombosis, wound infection, incisional hernia, skin breakdown, and others entailed on the consent form) and after a compliant diet and exercise program, the patient was scheduled for an elective laparoscopic sleeve gastrectomy.  Description of Operation:  Following informed consent, the patient was taken to the operating room and placed on the operating table in the supine position.  He had previously received prophylactic antibiotics and subcutaneous heparin for DVT prophylaxis in the pre-op holding area.  After induction of general endotracheal anesthesia by the anesthesiologist, the patient underwent placement of sequential compression devices and an oro-gastric tube.  A timeout was confirmed by the surgery and anesthesia teams.  The patient was adequately padded at all pressure points and placed on a footboard to prevent slippage from the OR table during extremes of position during surgery.  He underwent a routine sterile prep and drape of her entire abdomen.    Next, A transverse incision was made under the left subcostal area and a 48mm optical viewing trocar was introduced into the peritoneal cavity. Pneumoperitoneum was applied with  a high flow and low pressure. A laparoscope was inserted to confirm placement. A extraperitoneal block was then placed at the lateral abdominal wall using exparel diluted with marcaine. 5 additional incisions were placed: 1 56mm trocar to the left of the midline. 1 additional 22mm trocar in the left lateral area, 1 79mm trocar in the right mid abdomen, 1 33mm trocar in the right subcostal area, and a Nathanson retractor was placed through a subxiphoid incision.  The fat pad at the GE junction was incised and the gastrodiaphragmatic ligament was divided using the Harmonic scalpel. Next, a hole was created through the lesser omentum along the greater curve of the stomach to enter the lesser sac. The vessels along the greater omentum were  Then ligated and divided using the Harmonic scalpel moving towards the spleen and then short gastric vessels were ligated and divided in the same fashion to fully mobilize the fundus. The left crus was identified to ensure completion of the dissection. Next the antrum was measured and dissection continued inferiorly along the greater curve towards the pylorus and stopped 6cm from the pylorus.   A 40Fr ViSiGi dilator was placed into the esophgaus and along the lesser curve of the stomach and placed on suction. 1 non-reinforced 74mm Green load echelon stapler(s) followed by 1 58mm Gold load echelon stapler(s) followed by 3 70mm blue load echelon stapler(s) were used to make the resection along the antrum being sure to stay well away from the angularis by angling the jaws of the stapler towards the greater curve and later completing the resection staying along the ViSiGi and ensuring the fundus was not retained by appropriately retracting it lateral. Air was inserted through the ViSiGi to perform a leak test showing no bubbles and a neutral lie  of the stomach.  The assistant then went and performed an upper endoscopy and leak test. No bubbles were seen and the sleeve and antrum  distended appropriately. The specimen was then placed in an endocatch bag and removed by the 52m port. The fascia of the 142mport was closed with a 0 vicryl by suture passer. Hemostasis was ensured. Pneumoperitoneum was evacuated, all ports were removed and all incisions closed with 4-0 monocryl suture in subcuticular fashion. Steristrips and bandaids were put in place for dressing. The patient awoke from anesthesia and was brought to pacu in stable condition. All counts were correct.  Estimated blood loss: <3092mSpecimens:  Sleeve gastrectomy  Local Anesthesia: 50 ml Exparel:0.5% Marcaine mix  Post-Op Plan:       Pain Management: PO, prn      Antibiotics: Prophylactic      Anticoagulation: Prophylactic, Starting now      Post Op Studies/Consults: Not applicable      Intended Discharge: within 48h      Intended Outpatient Follow-Up: Two Week      Intended Outpatient Studies: Not Applicable      Other: Not Applicable  Images:      LukArta Brucensinger

## 2020-04-13 ENCOUNTER — Encounter (HOSPITAL_COMMUNITY): Payer: Self-pay | Admitting: General Surgery

## 2020-04-13 LAB — COMPREHENSIVE METABOLIC PANEL
ALT: 43 U/L (ref 0–44)
AST: 24 U/L (ref 15–41)
Albumin: 4.1 g/dL (ref 3.5–5.0)
Alkaline Phosphatase: 36 U/L — ABNORMAL LOW (ref 38–126)
Anion gap: 9 (ref 5–15)
BUN: 11 mg/dL (ref 6–20)
CO2: 26 mmol/L (ref 22–32)
Calcium: 8.9 mg/dL (ref 8.9–10.3)
Chloride: 103 mmol/L (ref 98–111)
Creatinine, Ser: 1.02 mg/dL (ref 0.61–1.24)
GFR calc Af Amer: >60 mL/min (ref >60)
GFR calc non Af Amer: >60 mL/min (ref >60)
Glucose, Bld: 148 mg/dL — ABNORMAL HIGH (ref 70–99)
Potassium: 4.5 mmol/L (ref 3.5–5.1)
Sodium: 138 mmol/L (ref 135–145)
Total Bilirubin: 0.6 mg/dL (ref 0.3–1.2)
Total Protein: 7.2 g/dL (ref 6.5–8.1)

## 2020-04-13 LAB — CBC WITH DIFFERENTIAL/PLATELET
Abs Immature Granulocytes: 0.06 10*3/uL (ref 0.00–0.07)
Basophils Absolute: 0 10*3/uL (ref 0.0–0.1)
Basophils Relative: 0 %
Eosinophils Absolute: 0 10*3/uL (ref 0.0–0.5)
Eosinophils Relative: 0 %
HCT: 43 % (ref 39.0–52.0)
Hemoglobin: 14.6 g/dL (ref 13.0–17.0)
Immature Granulocytes: 1 %
Lymphocytes Relative: 11 %
Lymphs Abs: 1.3 10*3/uL (ref 0.7–4.0)
MCH: 29.5 pg (ref 26.0–34.0)
MCHC: 34 g/dL (ref 30.0–36.0)
MCV: 86.9 fL (ref 80.0–100.0)
Monocytes Absolute: 0.9 10*3/uL (ref 0.1–1.0)
Monocytes Relative: 7 %
Neutro Abs: 9.6 10*3/uL — ABNORMAL HIGH (ref 1.7–7.7)
Neutrophils Relative %: 81 %
Platelets: 266 10*3/uL (ref 150–400)
RBC: 4.95 MIL/uL (ref 4.22–5.81)
RDW: 12.7 % (ref 11.5–15.5)
WBC: 11.8 10*3/uL — ABNORMAL HIGH (ref 4.0–10.5)
nRBC: 0 % (ref 0.0–0.2)

## 2020-04-13 LAB — SURGICAL PATHOLOGY

## 2020-04-13 MED ORDER — PANTOPRAZOLE SODIUM 40 MG PO TBEC
40.0000 mg | DELAYED_RELEASE_TABLET | Freq: Every day | ORAL | 0 refills | Status: AC
Start: 2020-04-13 — End: ?

## 2020-04-13 MED ORDER — ACETAMINOPHEN 500 MG PO TABS
1000.0000 mg | ORAL_TABLET | Freq: Three times a day (TID) | ORAL | 0 refills | Status: AC
Start: 2020-04-13 — End: 2020-04-18

## 2020-04-13 MED ORDER — GABAPENTIN 100 MG PO CAPS: 200.0000 mg | ORAL_CAPSULE | Freq: Two times a day (BID) | ORAL | 0 refills | Status: AC

## 2020-04-13 MED ORDER — ONDANSETRON 4 MG PO TBDP
4.0000 mg | ORAL_TABLET | Freq: Four times a day (QID) | ORAL | 0 refills | Status: AC | PRN
Start: 2020-04-13 — End: ?

## 2020-04-13 NOTE — Progress Notes (Signed)
Pt was discharged home today took him downstairs with wife.

## 2020-04-13 NOTE — Plan of Care (Signed)

## 2020-04-13 NOTE — Progress Notes (Signed)
Patient alert and oriented, pain is controlled. Patient is tolerating fluids, advanced to protein shake today, patient is tolerating well.  Reviewed Gastric sleeve discharge instructions with patient and patient is able to articulate understanding.  Provided information on BELT program, Support Group and [facility] outpatient pharmacy. All questions answered, will continue to monitor.  Total fluid intake 690 Per dehydration protocol call back one week post op 

## 2020-04-13 NOTE — Discharge Summary (Signed)
Physician Discharge Summary  Tod Abrahamsen XHB:[id number] DOB: [birth year] DOA: 04/12/2020  PCP: Jerl Mina, MD  Admit date: 04/12/2020 Discharge date: 04/13/2020  Recommendations for Outpatient Follow-up:  1.  (include homehealth, outpatient follow-up instructions, specific recommendations for PCP to follow-up on, etc.)   Follow-up Information    Lilyana Lippman, De Blanch, MD. Go on 05/04/2020.   Specialty: General Surgery Why: at 11 am.  Please arrive 15 minutes prior to appointment time.  Thank you Contact information: [address] [place] [address] [phone]        Surgery, [place]. Call in 8 week(s).   Specialty: General Surgery Why: Please call [facility] [phone] for 8 week post op appointment. Thank you Contact information: [address] [place] [address] [phone]              Discharge Diagnoses:  Active Problems:   Morbid obesity (HCC)   Surgical Procedure: laparoscopic sleeve gastrectomy, upper endoscopy  Discharge Condition: Good Disposition: Home  Diet recommendation: Postoperative sleeve gastrectomy diet (liquids only)  Filed Weights   04/12/20 0539  Weight: (!) 137.2 kg     Hospital Course:  The patient was admitted after undergoing laparoscopic sleeve gastrectomy. POD 0 he ambulated well. POD 1 he was started on the water diet protocol and tolerated 500 ml in the first shift. Once meeting the water amount he was advanced to bariatric protein shakes which they tolerated and were discharged home POD 1.  Treatments: surgery: laparoscopic sleeve gastrectomy  Discharge Instructions  Discharge Instructions    Ambulate hourly while awake   Complete by: As directed    Call MD for:  difficulty breathing, headache or visual disturbances   Complete by: As directed    Call MD for:  persistant dizziness or light-headedness   Complete by: As directed    Call MD for:  persistant nausea and vomiting   Complete  by: As directed    Call MD for:  redness, tenderness, or signs of infection (pain, swelling, redness, odor or green/yellow discharge around incision site)   Complete by: As directed    Call MD for:  severe uncontrolled pain   Complete by: As directed    Call MD for:  temperature >101 F   Complete by: As directed    Diet bariatric full liquid   Complete by: As directed    Discharge wound care:   Complete by: As directed    Remove Bandaids tomorrow, ok to shower tomorrow. Steristrips may fall off in 1-3 weeks.   Incentive spirometry   Complete by: As directed    Perform hourly while awake     Allergies as of 04/13/2020      Reactions   Penicillins Other (See Comments)   Unknown childhood reaction Has patient had a PCN reaction causing immediate rash, facial/tongue/throat swelling, SOB or lightheadedness with hypotension: Unknown Has patient had a PCN reaction causing severe rash involving mucus membranes or skin necrosis: Unknown Has patient had a PCN reaction that required hospitalization: Unknown Has patient had a PCN reaction occurring within the last 10 years: Unknown If all of the above answers are "NO", then may proceed with Cephalosporin use.      Medication List    TAKE these medications   acetaminophen 500 MG tablet Commonly known as: TYLENOL Take 2 tablets (1,000 mg total) by mouth every 8 (eight) hours for 5 days.   gabapentin 100 MG capsule Commonly known as: NEURONTIN Take 2 capsules (200  mg total) by mouth every 12 (twelve) hours.   losartan 50 MG tablet Commonly known as: COZAAR Take 50 mg by mouth daily. Notes to patient: Monitor Blood Pressure Daily and keep a log for primary care physician.  You may need to make changes to your medications with rapid weight loss.     ondansetron 4 MG disintegrating tablet Commonly known as: ZOFRAN-ODT Take 1 tablet (4 mg total) by mouth every 6 (six) hours as needed for nausea or vomiting.   pantoprazole 40 MG  tablet Commonly known as: PROTONIX Take 1 tablet (40 mg total) by mouth daily.   venlafaxine XR 150 MG 24 hr capsule Commonly known as: EFFEXOR-XR Take 150 mg by mouth daily.            Discharge Care Instructions  (From admission, onward)         Start     Ordered   04/13/20 0000  Discharge wound care:       Comments: Remove Bandaids tomorrow, ok to shower tomorrow. Steristrips may fall off in 1-3 weeks.   04/13/20 3016          Follow-up Information    Sabree Nuon, De Blanch, MD. Go on 05/04/2020.   Specialty: General Surgery Why: at 11 am.  Please arrive 15 minutes prior to appointment time.  Thank you Contact information: [address] [place] [address] [phone]        Surgery, [place]. Call in 8 week(s).   Specialty: General Surgery Why: Please call [facility] [phone] for 8 week post op appointment. Thank you Contact information: [address] [place] [address] [phone]                The results of significant diagnostics from this hospitalization (including imaging, microbiology, ancillary and laboratory) are listed below for reference.    Significant Diagnostic Studies: No results found.  Labs: Basic Metabolic Panel: Recent Labs  Lab 04/12/20 0545 04/13/20 0512  NA 138 138  K 4.1 4.5  CL 105 103  CO2 24 26  GLUCOSE 95 148*  BUN 24* 11  CREATININE 1.00 1.02  CALCIUM 9.3 8.9   Liver Function Tests: Recent Labs  Lab 04/12/20 0545 04/13/20 0512  AST 27 24  ALT 51* 43  ALKPHOS 44 36*  BILITOT 0.6 0.6  PROT 7.9 7.2  ALBUMIN 4.8 4.1    CBC: Recent Labs  Lab 04/12/20 0545 04/12/20 1037 04/13/20 0512  WBC 7.1  --  11.8*  NEUTROABS 4.1  --  9.6*  HGB 16.1 14.2 14.6  HCT 45.7 41.1 43.0  MCV 85.3  --  86.9  PLT 271  --  266    CBG: No results for input(s): GLUCAP in the last 168 hours.  Active Problems:   Morbid obesity (HCC)   VTE plan: no chemical prophylaxis  recommended ([email or web address])  Time coordinating discharge: 15 min

## 2020-04-14 NOTE — Anesthesia Postprocedure Evaluation (Signed)
Anesthesia Post Note  Patient: Engineer, mining  Procedure(s) Performed: LAPAROSCOPIC SLEEVE GASTRECTOMY (N/A ) UPPER GASTROINTESTINAL ENDOSCOPY (N/A )     Patient location during evaluation: PACU Anesthesia Type: General Level of consciousness: awake and alert Pain management: pain level controlled Vital Signs Assessment: post-procedure vital signs reviewed and stable Respiratory status: spontaneous breathing, nonlabored ventilation, respiratory function stable and patient connected to nasal cannula oxygen Cardiovascular status: blood pressure returned to baseline and stable Postop Assessment: no apparent nausea or vomiting Anesthetic complications: no   No complications documented.  Last Vitals:  Vitals:   04/13/20 0557 04/13/20 0557  BP: (!) 135/96 (!) 135/96  Pulse: 63 61  Resp: 16 16  Temp: 36.4 C 36.4 C  SpO2: 99% 99%    Last Pain:  Vitals:   04/13/20 0733  TempSrc:   PainSc: 0-No pain                 Jeffrey Keller

## 2020-04-18 ENCOUNTER — Telehealth (HOSPITAL_COMMUNITY): Payer: Self-pay

## 2020-04-18 NOTE — Telephone Encounter (Signed)
Patient called to discuss post bariatric surgery follow up questions.  See below:   1.  Tell me about your pain and pain management?pain with sneezing on right side,  Discussed this is typical with sleeve due to larger incision and stitch deep in muscle. Stated understanding  2.  Let's talk about fluid intake.  How much total fluid are you taking in?50-60 ounces  3.  How much protein have you taken in the last 2 days?30-50 grams  4.  Have you had nausea?  Tell me about when have experienced nausea and what you did to help?took one nausea pill day of discharge none since that time  5.  Has the frequency or color changed with your urine?urine darker in morning lightens by evening.  Discussed how important to monitor urine color dehydraiton  6.  Tell me what your incisions look like?wnl steristrips starting to come off  7.  Have you been passing gas? BM?had bm after miralax now having daily bm  8.  If a problem or question were to arise who would you call?  Do you know contact numbers for [facility], [facility], and [facility]?aware of how to contact services  9.  How has the walking going?walking without difficulty  10.  How are your vitamins and calcium going?  How are you taking them?started mvi and calcium no problems  Reminded of appointment with RD for diet progression and follow up .

## 2020-04-26 ENCOUNTER — Encounter: Payer: Managed Care, Other (non HMO) | Attending: General Surgery | Admitting: Skilled Nursing Facility1

## 2020-04-26 ENCOUNTER — Other Ambulatory Visit: Payer: Self-pay

## 2020-04-26 NOTE — Progress Notes (Signed)
2 Week Post-Operative Nutrition Class   Patient was seen on 10/21/18 for Post-Operative Nutrition education at the [facility].    Surgery date: 04/12/2020 Surgery type: sleeve Start weight at [facility]: 314.3 Weight today: pt declined   Body Composition Scale Pt declined  Total Body Fat %   Visceral Fat   Fat-Free Mass %    Total Body Water %    Muscle-Mass lbs   Body Fat Displacement          Torso  lbs          Left Leg  lbs          Right Leg  lbs          Left Arm  lbs          Right Arm   lbs      The following the learning objectives were met by the patient during this course:  Identifies Phase 3 (Soft, High Proteins) Dietary Goals and will begin from 2 weeks post-operatively to 2 months post-operatively  Identifies appropriate sources of fluids and proteins   States protein recommendations and appropriate sources post-operatively  Identifies the need for appropriate texture modifications, mastication, and bite sizes when consuming solids  Identifies appropriate multivitamin and calcium sources post-operatively  Describes the need for physical activity post-operatively and will follow MD recommendations  States when to call healthcare provider regarding medication questions or post-operative complications   Handouts given during class include:  Phase 3A: Soft, High Protein Diet Handout   Follow-Up Plan: Patient will follow-up at [facility] in 6 weeks for 2 month post-op nutrition visit for diet advancement per MD.

## 2020-05-03 ENCOUNTER — Telehealth: Payer: Self-pay | Admitting: Skilled Nursing Facility1

## 2020-05-03 NOTE — Telephone Encounter (Signed)
RD called pt to verify fluid intake once starting soft, solid proteins 2 week post-bariatric surgery.   Daily Fluid intake: 64+ Daily Protein intake: 80+  Concerns/issues:   No complaints.

## 2020-05-17 ENCOUNTER — Other Ambulatory Visit: Payer: Self-pay

## 2020-05-17 ENCOUNTER — Encounter: Payer: Managed Care, Other (non HMO) | Attending: General Surgery | Admitting: Skilled Nursing Facility1

## 2020-05-17 NOTE — Progress Notes (Signed)
Bariatric Nutrition Follow-Up Visit Medical Nutrition Therapy   Surgery Date: 04/12/2020  Pt's Expectations of Surgery/ Goals: to lose weight  Pt given star previously: no  NUTRITION ASSESSMENT    Anthropometrics  Start weight at [facility]: 314.3 lbs (date: 11/11/2019) Today's weight: 267.2 lbs  Body Composition Scale 05/17/2020  Weight  lbs 267.2  Total Body Fat  % 30.8     Visceral Fat 20  Fat-Free Mass  % 69.1     Total Body Water  % 50.1     Muscle-Mass  lbs 50.3  BMI 36.2  Body Fat Displacement ---        Torso  lbs 51.1        Left Leg  lbs 10.2        Right Leg  lbs 10.2        Left Arm  lbs 5.1        Right Arm  lbs 5.1   Clinical  Medical hx:  Medications:  Labs:    Lifestyle & Dietary Hx  Pt states he has diverticulosis before surgery having a bowel movement 3 times a day and now having one every 3 days.   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   24-Hr Dietary Recall First Meal 8: 2 soy sausage patties Snack 11-11:30: hard boiled egg Second Meal: chicken or tuna salad packet Snack: hard boiled egg Third Meal 5: chicken + edamame Snack: beef jerky Beverages: water, water + flavoring, unsweet tea + equal   Post-Op Goals/ Signs/ Symptoms Using straws: no Drinking while eating: no Chewing/swallowing difficulties: no Changes in vision: no Changes to mood/headaches: no Hair loss/changes to skin/nails: no Difficulty focusing/concentrating: no Sweating: no Dizziness/lightheadedness: no Palpitations: no  Carbonated/caffeinated beverages: no N/V/D/C/Gas: bowel movement every 3 days Abdominal pain: no Dumping syndrome: no    NUTRITION DIAGNOSIS  Overweight/obesity ([place]-3.3) related to past poor dietary habits and physical inactivity  as evidenced by completed bariatric surgery and following dietary guidelines for continued weight loss and healthy nutrition status.     NUTRITION INTERVENTION Nutrition counseling (C-1) and education (E-2) to facilitate bariatric surgery goals, including: . Diet advancement to the next phase (phase ) now including non starchy and starchy vegetable . The importance of consuming adequate calories as well as certain nutrients daily due to the body's need for essential vitamins, minerals, and fats . The importance of daily physical activity and to reach a goal of at least 150 minutes of moderate to vigorous physical activity weekly (or as directed by their physician) due to benefits such as increased musculature and improved lab values . The importance of intuitive eating specifically learning hunger-satiety cues and understanding the importance of learning a new body  Handouts Provided Include   Phase 4 + 5  Learning Style & Readiness for Change Teaching method utilized: Visual & Auditory  Demonstrated degree of understanding via: Teach Back  Barriers to learning/adherence to lifestyle change: none identified   RD's Notes for Next Visit . Assess adherence to pt chosen goals   MONITORING & EVALUATION Dietary intake, weekly physical activity, body weight  Next Steps Patient is to follow-up in 2 months

## 2020-06-07 ENCOUNTER — Ambulatory Visit: Payer: Managed Care, Other (non HMO) | Admitting: Dietician

## 2020-07-12 ENCOUNTER — Encounter: Payer: Managed Care, Other (non HMO) | Attending: General Surgery | Admitting: Skilled Nursing Facility1

## 2020-07-12 ENCOUNTER — Other Ambulatory Visit: Payer: Self-pay

## 2020-07-12 NOTE — Progress Notes (Signed)
Bariatric Nutrition Follow-Up Visit Medical Nutrition Therapy   Surgery Date: 04/12/2020  Pt's Expectations of Surgery/ Goals: to lose weight  Pt given star previously: no  NUTRITION ASSESSMENT    Anthropometrics  Start weight at [facility]: 314.3 lbs (date: 11/11/2019) Today's weight: pt declined  Body Composition Scale 05/17/2020  Weight  lbs 267.2  Total Body Fat  % 30.8     Visceral Fat 20  Fat-Free Mass  % 69.1     Total Body Water  % 50.1     Muscle-Mass  lbs 50.3  BMI 36.2  Body Fat Displacement ---        Torso  lbs 51.1        Left Leg  lbs 10.2        Right Leg  lbs 10.2        Left Arm  lbs 5.1        Right Arm  lbs 5.1   Clinical  Medical hx:  Medications:  Labs:    Lifestyle & Dietary Hx  Pt states he has diverticulosis before surgery having a bowel movement 3 times a day and now having one every 3 days sometimes he has movements a day.  Pt states his energy level has been good not needing naps.  Pt states since no longer going into the fridge to grab a beer he is spending more time with his children and creating a healthier bond. When asked what he feels his relationship with beer will be after 1 year pt states none he will not drink beer again. Pt states he owes his success to his very supportive friends and wife, his friends do not pressure him and allow him to make his own choices and wife cooks all of meals and meal preps for him.  Pt states he will start hitting the gym: Dietitian advised pt due to his very active job and working out in [employer] he will want to include complex carbohydrates in order to continue to build muscle mass, pt states that works for him he is a fan of complex carbohydrates.   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    24-Hr Dietary Recall: beef jerky multiple times a day every day First Meal 8: 2 soy sausage patties or hard boiled egg or egg sandwich with the egg being the bun Snack 11-11:30: hard boiled egg or jerky Second Meal: chicken or tuna salad packet or half chicken breast + green beans  Snack: hard boiled egg or jerky Third Meal 5: chicken + edamame Snack: beef jerky or peanut butter Beverages: water, water + flavoring, unsweet tea + equal   Post-Op Goals/ Signs/ Symptoms Using straws: no Drinking while eating: no Chewing/swallowing difficulties: no Changes in vision: no Changes to mood/headaches: no Hair loss/changes to skin/nails: no Difficulty focusing/concentrating: no Sweating: no Dizziness/lightheadedness: no Palpitations: no  Carbonated/caffeinated beverages: no N/V/D/C/Gas: bowel movement every 3 days Abdominal pain: no Dumping syndrome: no    NUTRITION DIAGNOSIS  Overweight/obesity ([place]-3.3) related to past poor dietary habits and physical inactivity as evidenced by completed bariatric surgery and following dietary guidelines for continued weight loss and healthy nutrition status.     NUTRITION INTERVENTION Nutrition counseling (C-1) and education (E-2) to facilitate bariatric surgery goals, including: . Diet advancement to the next phase (phase )  now including fruit and other complex carbohydrates . The importance of consuming adequate calories as well as certain nutrients daily due to the body's need for essential vitamins, minerals, and fats . The importance of daily physical activity and to reach a goal of at least 150 minutes of moderate to vigorous physical activity weekly (or as directed by their physician) due to benefits such as increased musculature and improved lab values . The importance of intuitive eating specifically learning hunger-satiety cues and understanding the importance of learning a new body . Educated pt on processed meats relationship with  cardiovascular disease and advised he limit his jerky consumption . Why you need complex carbohydrates: Whole grains and other complex carbohydrates are required to have a healthy diet. Whole grains provide fiber which can help with blood glucose levels and help keep you satiated. Fruits and starchy vegetables provide essential vitamins and minerals required for immune function, eyesight support, brain support, bone density, wound healing and many other functions within the body. According to the current evidenced based 2020-2025 Dietary Guidelines for Americans, complex carbohydrates are part of a healthy eating pattern which is associated with a decreased risk for type 2 diabetes, cancers, and cardiovascular disease.   Handouts Provided Include   Maintenance    Learning Style & Readiness for Change Teaching method utilized: Visual & Auditory  Demonstrated degree of understanding via: Teach Back  Barriers to learning/adherence to lifestyle change: none identified   RD's Notes for Next Visit . Assess adherence to pt chosen goals   MONITORING & EVALUATION Dietary intake, weekly physical activity, body weight  Next Steps Patient is to follow-up in 2 months

## 2020-12-18 ENCOUNTER — Emergency Department (HOSPITAL_COMMUNITY): Payer: Managed Care, Other (non HMO)

## 2020-12-18 ENCOUNTER — Encounter (HOSPITAL_COMMUNITY): Payer: Self-pay

## 2020-12-18 ENCOUNTER — Inpatient Hospital Stay (HOSPITAL_COMMUNITY)
Admission: EM | Admit: 2020-12-18 | Discharge: 2020-12-20 | DRG: 552 | Disposition: A | Discharge status: Home / Self Care | Payer: Managed Care, Other (non HMO) | Attending: Surgery | Admitting: Surgery

## 2020-12-18 DIAGNOSIS — M25519 Pain in unspecified shoulder: Secondary | ICD-10-CM

## 2020-12-18 DIAGNOSIS — S299XXA Unspecified injury of thorax, initial encounter: Secondary | ICD-10-CM

## 2020-12-18 DIAGNOSIS — F17210 Nicotine dependence, cigarettes, uncomplicated: Secondary | ICD-10-CM | POA: Diagnosis present

## 2020-12-18 DIAGNOSIS — Y908 Blood alcohol level of 240 mg/100 ml or more: Secondary | ICD-10-CM | POA: Diagnosis present

## 2020-12-18 DIAGNOSIS — Z20822 Contact with and (suspected) exposure to covid-19: Secondary | ICD-10-CM | POA: Diagnosis present

## 2020-12-18 DIAGNOSIS — S12000A Unspecified displaced fracture of first cervical vertebra, initial encounter for closed fracture: Principal | ICD-10-CM | POA: Diagnosis present

## 2020-12-18 DIAGNOSIS — F10129 Alcohol abuse with intoxication, unspecified: Secondary | ICD-10-CM | POA: Diagnosis present

## 2020-12-18 DIAGNOSIS — S42031A Displaced fracture of lateral end of right clavicle, initial encounter for closed fracture: Secondary | ICD-10-CM | POA: Diagnosis present

## 2020-12-18 DIAGNOSIS — S12600A Unspecified displaced fracture of seventh cervical vertebra, initial encounter for closed fracture: Secondary | ICD-10-CM | POA: Diagnosis present

## 2020-12-18 DIAGNOSIS — S134XXA Sprain of ligaments of cervical spine, initial encounter: Secondary | ICD-10-CM | POA: Diagnosis present

## 2020-12-18 DIAGNOSIS — S32019A Unspecified fracture of first lumbar vertebra, initial encounter for closed fracture: Secondary | ICD-10-CM | POA: Diagnosis present

## 2020-12-18 DIAGNOSIS — S2243XA Multiple fractures of ribs, bilateral, initial encounter for closed fracture: Secondary | ICD-10-CM | POA: Diagnosis present

## 2020-12-18 DIAGNOSIS — S022XXA Fracture of nasal bones, initial encounter for closed fracture: Secondary | ICD-10-CM | POA: Diagnosis present

## 2020-12-18 DIAGNOSIS — S00532A Contusion of oral cavity, initial encounter: Secondary | ICD-10-CM | POA: Diagnosis present

## 2020-12-18 DIAGNOSIS — Z880 Allergy status to penicillin: Secondary | ICD-10-CM

## 2020-12-18 DIAGNOSIS — I10 Essential (primary) hypertension: Secondary | ICD-10-CM | POA: Diagnosis present

## 2020-12-18 HISTORY — DX: Essential (primary) hypertension: I10

## 2020-12-18 LAB — LACTIC ACID, PLASMA: Lactic Acid, Venous: 2.5 mmol/L (ref 0.5–1.9)

## 2020-12-18 LAB — COMPREHENSIVE METABOLIC PANEL
ALT: 26 U/L (ref 0–44)
AST: 33 U/L (ref 15–41)
Albumin: 3.7 g/dL (ref 3.5–5.0)
Alkaline Phosphatase: 56 U/L (ref 38–126)
Anion gap: 10 (ref 5–15)
BUN: 7 mg/dL (ref 6–20)
CO2: 23 mmol/L (ref 22–32)
Calcium: 8.4 mg/dL — ABNORMAL LOW (ref 8.9–10.3)
Chloride: 103 mmol/L (ref 98–111)
Creatinine, Ser: 0.92 mg/dL (ref 0.61–1.24)
GFR, Estimated: 56 mL/min — ABNORMAL LOW (ref >60)
Glucose, Bld: 86 mg/dL (ref 70–99)
Potassium: 3.6 mmol/L (ref 3.5–5.1)
Sodium: 136 mmol/L (ref 135–145)
Total Bilirubin: 0.9 mg/dL (ref 0.3–1.2)
Total Protein: 6.3 g/dL — ABNORMAL LOW (ref 6.5–8.1)

## 2020-12-18 LAB — CBC
HCT: 42.8 % (ref 39.0–52.0)
Hemoglobin: 14.9 g/dL (ref 13.0–17.0)
MCH: 30.9 pg (ref 26.0–34.0)
MCHC: 34.8 g/dL (ref 30.0–36.0)
MCV: 88.8 fL (ref 80.0–100.0)
Platelets: 285 10*3/uL (ref 150–400)
RBC: 4.82 MIL/uL (ref 4.22–5.81)
RDW: 12.3 % (ref 11.5–15.5)
WBC: 10.4 10*3/uL (ref 4.0–10.5)
nRBC: 0 % (ref 0.0–0.2)

## 2020-12-18 LAB — I-STAT CHEM 8, ED
BUN: 8 mg/dL (ref 6–20)
Calcium, Ion: 1.04 mmol/L — ABNORMAL LOW (ref 1.15–1.40)
Chloride: 101 mmol/L (ref 98–111)
Creatinine, Ser: 1.3 mg/dL — ABNORMAL HIGH (ref 0.61–1.24)
Glucose, Bld: 86 mg/dL (ref 70–99)
HCT: 42 % (ref 39.0–52.0)
Hemoglobin: 14.3 g/dL (ref 13.0–17.0)
Potassium: 3.5 mmol/L (ref 3.5–5.1)
Sodium: 138 mmol/L (ref 135–145)
TCO2: 27 mmol/L (ref 22–32)

## 2020-12-18 LAB — PROTIME-INR
INR: 1.1 (ref 0.8–1.2)
Prothrombin Time: 14.1 seconds (ref 11.4–15.2)

## 2020-12-18 LAB — SAMPLE TO BLOOD BANK

## 2020-12-18 LAB — ETHANOL: Alcohol, Ethyl (B): 246 mg/dL — ABNORMAL HIGH (ref <10)

## 2020-12-18 NOTE — ED Notes (Signed)
Patient's jewelry and watch placed in belonging's cup

## 2020-12-18 NOTE — ED Notes (Signed)
Rigid c-collar replaced with Quin Hoop
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
## 2020-12-19 ENCOUNTER — Emergency Department (HOSPITAL_COMMUNITY): Payer: Managed Care, Other (non HMO)

## 2020-12-19 DIAGNOSIS — Z20822 Contact with and (suspected) exposure to covid-19: Secondary | ICD-10-CM | POA: Diagnosis present

## 2020-12-19 DIAGNOSIS — S42031A Displaced fracture of lateral end of right clavicle, initial encounter for closed fracture: Secondary | ICD-10-CM | POA: Diagnosis present

## 2020-12-19 DIAGNOSIS — S12000A Unspecified displaced fracture of first cervical vertebra, initial encounter for closed fracture: Secondary | ICD-10-CM | POA: Diagnosis present

## 2020-12-19 DIAGNOSIS — S022XXA Fracture of nasal bones, initial encounter for closed fracture: Secondary | ICD-10-CM | POA: Diagnosis present

## 2020-12-19 DIAGNOSIS — F17210 Nicotine dependence, cigarettes, uncomplicated: Secondary | ICD-10-CM | POA: Diagnosis present

## 2020-12-19 DIAGNOSIS — F10129 Alcohol abuse with intoxication, unspecified: Secondary | ICD-10-CM | POA: Diagnosis present

## 2020-12-19 DIAGNOSIS — Z880 Allergy status to penicillin: Secondary | ICD-10-CM | POA: Diagnosis not present

## 2020-12-19 DIAGNOSIS — S12600A Unspecified displaced fracture of seventh cervical vertebra, initial encounter for closed fracture: Secondary | ICD-10-CM | POA: Diagnosis present

## 2020-12-19 DIAGNOSIS — S2243XA Multiple fractures of ribs, bilateral, initial encounter for closed fracture: Secondary | ICD-10-CM | POA: Diagnosis present

## 2020-12-19 DIAGNOSIS — S00532A Contusion of oral cavity, initial encounter: Secondary | ICD-10-CM | POA: Diagnosis present

## 2020-12-19 DIAGNOSIS — I10 Essential (primary) hypertension: Secondary | ICD-10-CM | POA: Diagnosis present

## 2020-12-19 DIAGNOSIS — Y908 Blood alcohol level of 240 mg/100 ml or more: Secondary | ICD-10-CM | POA: Diagnosis present

## 2020-12-19 DIAGNOSIS — S134XXA Sprain of ligaments of cervical spine, initial encounter: Secondary | ICD-10-CM | POA: Diagnosis present

## 2020-12-19 DIAGNOSIS — S32019A Unspecified fracture of first lumbar vertebra, initial encounter for closed fracture: Secondary | ICD-10-CM | POA: Diagnosis present

## 2020-12-19 LAB — URINALYSIS, ROUTINE W REFLEX MICROSCOPIC
Bilirubin Urine: NEGATIVE
Glucose, UA: NEGATIVE mg/dL
Hgb urine dipstick: NEGATIVE
Ketones, ur: NEGATIVE mg/dL
Leukocytes,Ua: NEGATIVE
Nitrite: NEGATIVE
Protein, ur: NEGATIVE mg/dL
Specific Gravity, Urine: 1.013 (ref 1.005–1.030)
pH: 6 (ref 5.0–8.0)

## 2020-12-19 LAB — COMPREHENSIVE METABOLIC PANEL
ALT: 26 U/L (ref 0–44)
AST: 33 U/L (ref 15–41)
Albumin: 3.8 g/dL (ref 3.5–5.0)
Alkaline Phosphatase: 61 U/L (ref 38–126)
Anion gap: 10 (ref 5–15)
BUN: 6 mg/dL (ref 6–20)
CO2: 25 mmol/L (ref 22–32)
Calcium: 8.4 mg/dL — ABNORMAL LOW (ref 8.9–10.3)
Chloride: 104 mmol/L (ref 98–111)
Creatinine, Ser: 0.89 mg/dL (ref 0.61–1.24)
GFR, Estimated: >60 mL/min (ref >60)
Glucose, Bld: 85 mg/dL (ref 70–99)
Potassium: 4.1 mmol/L (ref 3.5–5.1)
Sodium: 139 mmol/L (ref 135–145)
Total Bilirubin: 1 mg/dL (ref 0.3–1.2)
Total Protein: 6.4 g/dL — ABNORMAL LOW (ref 6.5–8.1)

## 2020-12-19 LAB — RESP PANEL BY RT-PCR (FLU A&B, COVID) ARPGX2
Influenza A by PCR: NEGATIVE
Influenza B by PCR: NEGATIVE
SARS Coronavirus 2 by RT PCR: NEGATIVE

## 2020-12-19 LAB — CBC
HCT: 41.2 % (ref 39.0–52.0)
Hemoglobin: 14.4 g/dL (ref 13.0–17.0)
MCH: 31 pg (ref 26.0–34.0)
MCHC: 35 g/dL (ref 30.0–36.0)
MCV: 88.6 fL (ref 80.0–100.0)
Platelets: 287 10*3/uL (ref 150–400)
RBC: 4.65 MIL/uL (ref 4.22–5.81)
RDW: 12.6 % (ref 11.5–15.5)
WBC: 11.1 10*3/uL — ABNORMAL HIGH (ref 4.0–10.5)
nRBC: 0 % (ref 0.0–0.2)

## 2020-12-19 LAB — PHOSPHORUS: Phosphorus: 5.3 mg/dL — ABNORMAL HIGH (ref 2.5–4.6)

## 2020-12-19 LAB — HIV ANTIBODY (ROUTINE TESTING W REFLEX): HIV Screen 4th Generation wRfx: NONREACTIVE

## 2020-12-19 LAB — MRSA PCR SCREENING: MRSA by PCR: NEGATIVE

## 2020-12-19 LAB — MAGNESIUM: Magnesium: 1.9 mg/dL (ref 1.7–2.4)

## 2020-12-19 MED ORDER — MORPHINE SULFATE (PF) 4 MG/ML IV SOLN
4.0000 mg | Freq: Once | INTRAVENOUS | Status: AC
Start: 2020-12-19 — End: 2020-12-19

## 2020-12-19 MED ORDER — THIAMINE HCL 100 MG/ML IJ SOLN: 100.0000 mg | Freq: Every day | INTRAMUSCULAR | Status: DC

## 2020-12-19 MED ORDER — LORAZEPAM 1 MG PO TABS: 1.0000 mg | ORAL_TABLET | ORAL | Status: DC | PRN

## 2020-12-19 MED ORDER — LACTATED RINGERS IV SOLN: INTRAVENOUS | Status: DC

## 2020-12-19 MED ORDER — FOLIC ACID 1 MG PO TABS
1.0000 mg | ORAL_TABLET | Freq: Every day | ORAL | Status: DC
  Administered 2020-12-19 – 2020-12-20 (×2): 1 mg via ORAL
  Filled 2020-12-19 (×2): qty 1

## 2020-12-19 MED ORDER — IBUPROFEN 200 MG PO TABS: 600.0000 mg | ORAL_TABLET | Freq: Four times a day (QID) | ORAL | Status: DC | PRN

## 2020-12-19 MED ORDER — ONDANSETRON HCL 4 MG/2ML IJ SOLN: 4.0000 mg | Freq: Four times a day (QID) | INTRAMUSCULAR | Status: DC | PRN

## 2020-12-19 MED ORDER — IBUPROFEN 200 MG PO TABS
600.0000 mg | ORAL_TABLET | Freq: Three times a day (TID) | ORAL | Status: DC
  Administered 2020-12-19 – 2020-12-20 (×5): 600 mg via ORAL
  Filled 2020-12-19 (×5): qty 3

## 2020-12-19 MED ORDER — IOHEXOL 300 MG/ML  SOLN
100.0000 mL | Freq: Once | INTRAMUSCULAR | Status: AC | PRN
  Administered 2020-12-19: 100 mL via INTRAVENOUS

## 2020-12-19 MED ORDER — MORPHINE SULFATE (PF) 4 MG/ML IV SOLN
INTRAVENOUS | Status: AC
  Administered 2020-12-19: 4 mg via INTRAVENOUS
  Filled 2020-12-19: qty 1

## 2020-12-19 MED ORDER — METHOCARBAMOL 1000 MG/10ML IJ SOLN
1000.0000 mg | Freq: Three times a day (TID) | INTRAVENOUS | Status: DC
  Filled 2020-12-19: qty 10

## 2020-12-19 MED ORDER — SODIUM CHLORIDE 0.9 % IV SOLN: INTRAVENOUS | Status: DC

## 2020-12-19 MED ORDER — GABAPENTIN 300 MG PO CAPS
300.0000 mg | ORAL_CAPSULE | Freq: Three times a day (TID) | ORAL | Status: DC
  Administered 2020-12-19 – 2020-12-20 (×4): 300 mg via ORAL
  Filled 2020-12-19 (×4): qty 1

## 2020-12-19 MED ORDER — HYDROMORPHONE HCL 1 MG/ML IJ SOLN
1.0000 mg | Freq: Once | INTRAMUSCULAR | Status: AC
  Administered 2020-12-19: 1 mg via INTRAVENOUS
  Filled 2020-12-19: qty 1

## 2020-12-19 MED ORDER — ENOXAPARIN SODIUM 30 MG/0.3ML ~~LOC~~ SOLN
30.0000 mg | Freq: Two times a day (BID) | SUBCUTANEOUS | Status: DC
  Administered 2020-12-19 – 2020-12-20 (×3): 30 mg via SUBCUTANEOUS
  Filled 2020-12-19 (×3): qty 0.3

## 2020-12-19 MED ORDER — HYDRALAZINE HCL 20 MG/ML IJ SOLN: 10.0000 mg | INTRAMUSCULAR | Status: DC | PRN

## 2020-12-19 MED ORDER — ONDANSETRON 4 MG PO TBDP
4.0000 mg | ORAL_TABLET | Freq: Four times a day (QID) | ORAL | Status: DC | PRN
Start: 2020-12-19 — End: 2020-12-21

## 2020-12-19 MED ORDER — THIAMINE HCL 100 MG PO TABS
100.0000 mg | ORAL_TABLET | Freq: Every day | ORAL | Status: DC
  Administered 2020-12-19 – 2020-12-20 (×2): 100 mg via ORAL
  Filled 2020-12-19 (×2): qty 1

## 2020-12-19 MED ORDER — GABAPENTIN 600 MG PO TABS
300.0000 mg | ORAL_TABLET | Freq: Three times a day (TID) | ORAL | Status: DC
  Administered 2020-12-19: 300 mg via ORAL
  Filled 2020-12-19: qty 1

## 2020-12-19 MED ORDER — HYDROMORPHONE HCL 1 MG/ML IJ SOLN
0.5000 mg | INTRAMUSCULAR | Status: DC | PRN
  Administered 2020-12-19 (×2): 0.5 mg via INTRAVENOUS
  Filled 2020-12-19 (×2): qty 1

## 2020-12-19 MED ORDER — ACETAMINOPHEN 325 MG PO TABS
650.0000 mg | ORAL_TABLET | Freq: Four times a day (QID) | ORAL | Status: DC
  Administered 2020-12-19: 650 mg via ORAL
  Filled 2020-12-19: qty 2

## 2020-12-19 MED ORDER — ADULT MULTIVITAMIN W/MINERALS CH
1.0000 | ORAL_TABLET | Freq: Every day | ORAL | Status: DC
  Administered 2020-12-19 – 2020-12-20 (×2): 1 via ORAL
  Filled 2020-12-19 (×2): qty 1

## 2020-12-19 MED ORDER — METHOCARBAMOL 500 MG PO TABS
1000.0000 mg | ORAL_TABLET | Freq: Three times a day (TID) | ORAL | Status: DC
  Administered 2020-12-19 – 2020-12-20 (×4): 1000 mg via ORAL
  Filled 2020-12-19 (×4): qty 2

## 2020-12-19 MED ORDER — DOCUSATE SODIUM 100 MG PO CAPS
200.0000 mg | ORAL_CAPSULE | Freq: Two times a day (BID) | ORAL | Status: DC
  Administered 2020-12-19 – 2020-12-20 (×4): 200 mg via ORAL
  Filled 2020-12-19 (×4): qty 2

## 2020-12-19 MED ORDER — LORAZEPAM 2 MG/ML IJ SOLN: 1.0000 mg | INTRAMUSCULAR | Status: DC | PRN

## 2020-12-19 MED ORDER — OXYCODONE HCL 5 MG PO TABS
5.0000 mg | ORAL_TABLET | Freq: Four times a day (QID) | ORAL | Status: DC | PRN
  Administered 2020-12-19 – 2020-12-20 (×5): 10 mg via ORAL
  Filled 2020-12-19 (×6): qty 2

## 2020-12-19 MED ORDER — ACETAMINOPHEN 500 MG PO TABS
1000.0000 mg | ORAL_TABLET | Freq: Four times a day (QID) | ORAL | Status: DC
  Administered 2020-12-19 – 2020-12-20 (×5): 1000 mg via ORAL
  Filled 2020-12-19 (×5): qty 2

## 2020-12-19 MED ORDER — ONDANSETRON HCL 4 MG/2ML IJ SOLN
INTRAMUSCULAR | Status: AC
  Administered 2020-12-19: 4 mg
  Filled 2020-12-19: qty 2
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
## 2020-12-19 NOTE — Progress Notes (Signed)
Orthopedic Tech Progress Note Patient Details:  Jeffrey Keller 05/25/1986 [id number]  Ortho Devices Type of Ortho Device: Lumbar corsett Ortho Device/Splint Location: BACK Ortho Device/Splint Interventions: Ordered,Adjustment   Post Interventions Patient Tolerated: Well Instructions Provided: Care of device   [name] 12/19/2020, 6:57 PM

## 2020-12-19 NOTE — ED Notes (Signed)
Pt returned from MRI, pt c/o pain to neck and back. Neuro intact, GCS 15

## 2020-12-19 NOTE — ED Notes (Signed)
Dr. Dion Saucier at bedside
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
## 2020-12-19 NOTE — ED Notes (Signed)
Patient transported to MRI 
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
## 2020-12-20 ENCOUNTER — Inpatient Hospital Stay (HOSPITAL_COMMUNITY): Payer: Managed Care, Other (non HMO)

## 2020-12-20 LAB — BASIC METABOLIC PANEL
Anion gap: 8 (ref 5–15)
BUN: 14 mg/dL (ref 6–20)
CO2: 28 mmol/L (ref 22–32)
Calcium: 8.5 mg/dL — ABNORMAL LOW (ref 8.9–10.3)
Chloride: 102 mmol/L (ref 98–111)
Creatinine, Ser: 0.92 mg/dL (ref 0.61–1.24)
GFR, Estimated: >60 mL/min (ref >60)
Glucose, Bld: 120 mg/dL — ABNORMAL HIGH (ref 70–99)
Potassium: 3.7 mmol/L (ref 3.5–5.1)
Sodium: 138 mmol/L (ref 135–145)

## 2020-12-20 LAB — CBC
HCT: 41 % (ref 39.0–52.0)
Hemoglobin: 14 g/dL (ref 13.0–17.0)
MCH: 30.6 pg (ref 26.0–34.0)
MCHC: 34.1 g/dL (ref 30.0–36.0)
MCV: 89.7 fL (ref 80.0–100.0)
Platelets: 250 10*3/uL (ref 150–400)
RBC: 4.57 MIL/uL (ref 4.22–5.81)
RDW: 12.7 % (ref 11.5–15.5)
WBC: 6.8 10*3/uL (ref 4.0–10.5)
nRBC: 0 % (ref 0.0–0.2)

## 2020-12-20 MED ORDER — ADULT MULTIVITAMIN W/MINERALS CH: 1.0000 | ORAL_TABLET | Freq: Every day | ORAL | Status: AC

## 2020-12-20 MED ORDER — GABAPENTIN 300 MG PO CAPS: 300.0000 mg | ORAL_CAPSULE | Freq: Three times a day (TID) | ORAL | 0 refills | Status: AC

## 2020-12-20 MED ORDER — METHOCARBAMOL 500 MG PO TABS: 1000.0000 mg | ORAL_TABLET | Freq: Three times a day (TID) | ORAL | 0 refills | Status: AC

## 2020-12-20 MED ORDER — OXYCODONE HCL 10 MG PO TABS: 10.0000 mg | ORAL_TABLET | Freq: Four times a day (QID) | ORAL | 0 refills | Status: AC | PRN

## 2020-12-20 MED ORDER — ENSURE ENLIVE PO LIQD: 237.0000 mL | Freq: Two times a day (BID) | ORAL | Status: DC

## 2020-12-20 MED ORDER — ACETAMINOPHEN 500 MG PO TABS: 1000.0000 mg | ORAL_TABLET | Freq: Four times a day (QID) | ORAL | 0 refills | Status: AC

## 2020-12-20 MED ORDER — IBUPROFEN 600 MG PO TABS: 600.0000 mg | ORAL_TABLET | Freq: Three times a day (TID) | ORAL | 0 refills | Status: AC | PRN

## 2020-12-20 NOTE — Progress Notes (Signed)
Orthopedic Tech Progress Note Patient Details:  Jeffrey Keller 1986-04-19 [id number] Patient discharged Patient ID: Jeffrey Keller, male   DOB: Jun 16, 1986, 35 y.o.   MRN: [id number]   Jeffrey Keller 12/20/2020, 5:50 PM

## 2020-12-20 NOTE — Progress Notes (Signed)
Pt discharged home with home health. PIV removed, personal belongings packed, 3n1 and walker at bedside. Pt and belongings taken to private vehicle.   Robina Ade, RN
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
## 2021-01-11 ENCOUNTER — Ambulatory Visit
Admission: EM | Admit: 2021-01-11 | Discharge: 2021-01-11 | Disposition: A | Discharge status: Home / Self Care | Payer: Managed Care, Other (non HMO) | Attending: Emergency Medicine | Admitting: Emergency Medicine

## 2021-01-11 ENCOUNTER — Encounter: Payer: Self-pay | Admitting: Emergency Medicine

## 2021-01-11 ENCOUNTER — Other Ambulatory Visit: Payer: Self-pay

## 2021-01-11 DIAGNOSIS — J029 Acute pharyngitis, unspecified: Secondary | ICD-10-CM | POA: Insufficient documentation

## 2021-01-11 DIAGNOSIS — J019 Acute sinusitis, unspecified: Secondary | ICD-10-CM | POA: Diagnosis present

## 2021-01-11 DIAGNOSIS — R059 Cough, unspecified: Secondary | ICD-10-CM | POA: Insufficient documentation

## 2021-01-11 LAB — POCT RAPID STREP A (OFFICE): Rapid Strep A Screen: NEGATIVE

## 2021-01-11 MED ORDER — CETIRIZINE HCL 10 MG PO CAPS: 10.0000 mg | ORAL_CAPSULE | Freq: Every day | ORAL | 0 refills | Status: AC

## 2021-01-11 MED ORDER — AMOXICILLIN-POT CLAVULANATE 875-125 MG PO TABS: 1.0000 | ORAL_TABLET | Freq: Two times a day (BID) | ORAL | 0 refills | Status: DC

## 2021-01-11 MED ORDER — BENZONATATE 200 MG PO CAPS: 200.0000 mg | ORAL_CAPSULE | Freq: Three times a day (TID) | ORAL | 0 refills | Status: AC | PRN

## 2021-01-11 NOTE — Discharge Instructions (Signed)
Strep negative, COVID and flu test pending Begin Augmentin twice daily for the next week Daily cetirizine/Zyrtec to help with postnasal drainage Tessalon every 8 hours for cough May continue with over-the-counter medicines as needed.  Follow-up if not improving or worsening
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
## 2021-01-12 LAB — CULTURE, GROUP A STREP (THRC)

## 2021-01-12 LAB — COVID-19, FLU A+B NAA
Influenza A, NAA: DETECTED — AB
Influenza B, NAA: NOT DETECTED
SARS-CoV-2, NAA: NOT DETECTED

## 2021-11-02 ENCOUNTER — Encounter (HOSPITAL_COMMUNITY): Payer: Self-pay | Admitting: *Deleted
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
## 2022-11-02 ENCOUNTER — Encounter (HOSPITAL_COMMUNITY): Payer: Self-pay | Admitting: *Deleted

## 2023-11-15 ENCOUNTER — Encounter (HOSPITAL_COMMUNITY): Payer: Self-pay | Admitting: *Deleted

## 2024-03-03 ENCOUNTER — Ambulatory Visit
Admission: RE | Admit: 2024-03-03 | Discharge: 2024-03-03 | Disposition: A | Discharge status: Home / Self Care | Source: Ambulatory Visit | Attending: Family Medicine | Admitting: Family Medicine

## 2024-03-03 VITALS — BP 128/81 | HR 58 | Temp 98.1°F | Resp 18 | Ht 72.0 in | Wt 235.0 lb

## 2024-03-03 DIAGNOSIS — H6091 Unspecified otitis externa, right ear: Secondary | ICD-10-CM | POA: Diagnosis not present

## 2024-03-03 DIAGNOSIS — H6691 Otitis media, unspecified, right ear: Secondary | ICD-10-CM

## 2024-03-03 MED ORDER — CIPROFLOXACIN-DEXAMETHASONE 0.3-0.1 % OT SUSP: 4.0000 [drp] | Freq: Two times a day (BID) | OTIC | 0 refills | Status: AC

## 2024-03-03 MED ORDER — AMOXICILLIN-POT CLAVULANATE 875-125 MG PO TABS: 1.0000 | ORAL_TABLET | Freq: Two times a day (BID) | ORAL | 0 refills | Status: AC

## 2024-03-03 NOTE — Discharge Instructions (Addendum)
 Advised patient to take medication as directed with food to completion.  Advised patient to instill eardrops as directed.  Encouraged to increase daily water  intake to 64 ounces per day while taking these medications.  Advised if symptoms worsen and/or unresolved please follow-up with your PCP or here for further evaluation.
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
## 2024-03-03 NOTE — ED Triage Notes (Signed)
 Patient c/o right ear pain, possible swimmers ear for the last couple of days.  Extremely painful.  Patient has tried OTC swimmers ear drops.

## 2024-05-28 ENCOUNTER — Ambulatory Visit
Admission: RE | Admit: 2024-05-28 | Discharge: 2024-05-28 | Disposition: A | Discharge status: Home / Self Care | Attending: Gastroenterology | Admitting: Gastroenterology

## 2024-05-28 ENCOUNTER — Ambulatory Visit: Admitting: Anesthesiology

## 2024-05-28 ENCOUNTER — Encounter
Admission: RE | Disposition: A | Discharge status: Home / Self Care | Payer: Self-pay | Source: Home / Self Care | Attending: Gastroenterology

## 2024-05-28 ENCOUNTER — Encounter: Payer: Self-pay | Admitting: Gastroenterology

## 2024-05-28 ENCOUNTER — Other Ambulatory Visit: Payer: Self-pay

## 2024-05-28 DIAGNOSIS — E66813 Obesity, class 3: Secondary | ICD-10-CM | POA: Diagnosis not present

## 2024-05-28 DIAGNOSIS — Z6831 Body mass index (BMI) 31.0-31.9, adult: Secondary | ICD-10-CM | POA: Diagnosis not present

## 2024-05-28 DIAGNOSIS — K219 Gastro-esophageal reflux disease without esophagitis: Secondary | ICD-10-CM | POA: Insufficient documentation

## 2024-05-28 DIAGNOSIS — Z79899 Other long term (current) drug therapy: Secondary | ICD-10-CM | POA: Insufficient documentation

## 2024-05-28 DIAGNOSIS — I10 Essential (primary) hypertension: Secondary | ICD-10-CM | POA: Insufficient documentation

## 2024-05-28 DIAGNOSIS — Z87891 Personal history of nicotine dependence: Secondary | ICD-10-CM | POA: Insufficient documentation

## 2024-05-28 DIAGNOSIS — Z800 Family history of malignant neoplasm of digestive organs: Secondary | ICD-10-CM | POA: Insufficient documentation

## 2024-05-28 DIAGNOSIS — F419 Anxiety disorder, unspecified: Secondary | ICD-10-CM | POA: Diagnosis not present

## 2024-05-28 DIAGNOSIS — Z1211 Encounter for screening for malignant neoplasm of colon: Secondary | ICD-10-CM | POA: Diagnosis present

## 2024-05-28 DIAGNOSIS — Z83719 Family history of colon polyps, unspecified: Secondary | ICD-10-CM | POA: Insufficient documentation

## 2024-05-28 DIAGNOSIS — G4730 Sleep apnea, unspecified: Secondary | ICD-10-CM | POA: Diagnosis not present

## 2024-05-28 HISTORY — PX: COLONOSCOPY: SHX5424

## 2024-05-28 SURGERY — COLONOSCOPY
Anesthesia: General

## 2024-05-28 MED ORDER — SODIUM CHLORIDE 0.9 % IV SOLN: INTRAVENOUS | Status: DC

## 2024-05-28 MED ORDER — LIDOCAINE HCL (CARDIAC) PF 100 MG/5ML IV SOSY
PREFILLED_SYRINGE | INTRAVENOUS | Status: DC | PRN
  Administered 2024-05-28: 100 mg via INTRAVENOUS

## 2024-05-28 MED ORDER — PROPOFOL 1000 MG/100ML IV EMUL
INTRAVENOUS | Status: AC
  Filled 2024-05-28: qty 100

## 2024-05-28 MED ORDER — PROPOFOL 500 MG/50ML IV EMUL
INTRAVENOUS | Status: DC | PRN
  Administered 2024-05-28: 100 mg via INTRAVENOUS
  Administered 2024-05-28: 140 ug/kg/min via INTRAVENOUS

## 2024-05-28 MED ORDER — LIDOCAINE HCL (PF) 2 % IJ SOLN
INTRAMUSCULAR | Status: AC
  Filled 2024-05-28: qty 5

## 2024-05-28 NOTE — Anesthesia Postprocedure Evaluation (Signed)
 Anesthesia Post Note  Patient: Jeffrey Keller  Procedure(s) Performed: COLONOSCOPY  Patient location during evaluation: PACU Anesthesia Type: General Level of consciousness: awake Pain management: satisfactory to patient Vital Signs Assessment: post-procedure vital signs reviewed and stable Respiratory status: spontaneous breathing Cardiovascular status: stable Anesthetic complications: no   No notable events documented.   Last Vitals:  Vitals:   05/28/24 0820 05/28/24 0824  BP: 117/77 113/73  Pulse: (!) 59 69  Resp: 14 15  Temp:    SpO2: 100% 100%    Last Pain:  Vitals:   05/28/24 0824  TempSrc:   PainSc: 0-No pain                 [name],[name]

## 2024-05-28 NOTE — Transfer of Care (Signed)
 Immediate Anesthesia Transfer of Care Note  Patient: Jeffrey Keller  Procedure(s) Performed: COLONOSCOPY  Patient Location: PACU  Anesthesia Type:MAC  Level of Consciousness: awake and alert   Airway & Oxygen Therapy: Patient Spontanous Breathing  Post-op Assessment: Report given to RN and Post -op Vital signs reviewed and stable  Post vital signs: stable  Last Vitals:  Vitals Value Taken Time  BP 109/65 05/28/24 08:05  Temp 36.2 C 05/28/24 08:04  Pulse 69 05/28/24 08:06  Resp 14 05/28/24 08:06  SpO2 98 % 05/28/24 08:06  Vitals shown include unfiled device data.  Last Pain:  Vitals:   05/28/24 0804  TempSrc: Temporal  PainSc: 0-No pain         Complications: No notable events documented.

## 2024-05-28 NOTE — Op Note (Signed)
 [facility] Gastroenterology Patient Name: Jeffrey Keller Procedure Date: 05/28/2024 7:11 AM MRN: [id number] Account #: [id number] Date of Birth: 08-25-1986 Admit Type: Outpatient Age: 38 Room: [facility] ENDO ROOM 2 Gender: Male Note Status: Finalized Instrument Name: Colon Scope [id number] Procedure:             Colonoscopy Indications:           Colon cancer screening in patient at increased risk:                         Family history of 1st-degree relative with colon                         polyps, Screening in patient at increased risk:                         Colorectal cancer in brother before age 69 Providers:             Ruel Kung MD, MD Referring MD:          Lynwood FALCON. Valora, MD (Referring MD) Medicines:             Monitored Anesthesia Care Complications:         No immediate complications. Procedure:             Pre-Anesthesia Assessment:                        - Prior to the procedure, a History and Physical was                         performed, and patient medications, allergies and                         sensitivities were reviewed. The patient's tolerance                         of previous anesthesia was reviewed.                        - The risks and benefits of the procedure and the                         sedation options and risks were discussed with the                         patient. All questions were answered and informed                         consent was obtained.                        - ASA Grade Assessment: II - A patient with mild                         systemic disease.                        After obtaining informed consent, the colonoscope was  passed under direct vision. Throughout the procedure,                         the patient's blood pressure, pulse, and oxygen                         saturations were monitored continuously. The                         Colonoscope was introduced through the anus and                          advanced to the the cecum, identified by the                         appendiceal orifice. The colonoscopy was performed                         with ease. The patient tolerated the procedure well.                         The quality of the bowel preparation was excellent.                         The ileocecal valve, appendiceal orifice, and rectum                         were photographed. Findings:      The perianal and digital rectal examinations were normal.      The entire examined colon appeared normal on direct and retroflexion       views. Impression:            - The entire examined colon is normal on direct and                         retroflexion views.                        - No specimens collected. Recommendation:        - Discharge patient to home (with escort).                        - Resume previous diet.                        - Continue present medications.                        - Repeat colonoscopy in 5 years for screening purposes. Procedure Code(s):     --- Professional ---                        [phone], Colonoscopy, flexible; diagnostic, including                         collection of specimen(s) by brushing or washing, when                         performed (separate procedure) Diagnosis Code(s):     --- Professional ---  Z83.71, Family history of colonic polyps                        Z80.0, Family history of malignant neoplasm of                         digestive organs CPT copyright 2022 American Medical Association. All rights reserved. The codes documented in this report are preliminary and upon coder review may  be revised to meet current compliance requirements. Ruel Kung, MD Ruel Kung MD, MD 05/28/2024 8:04:33 AM This report has been signed electronically. Number of Addenda: 0 Note Initiated On: 05/28/2024 7:11 AM Scope Withdrawal Time: 0 hours 9 minutes 11 seconds  Total Procedure Duration: 0 hours 11  minutes 32 seconds  Estimated Blood Loss:  Estimated blood loss: none.      [facility]

## 2024-05-28 NOTE — H&P (Signed)
 Ruel Kung , MD [address], [place], [place], [address] Phone: [phone] Fax: [phone]  Primary Care Physician:  Stanton Lynwood FALCON, MD   Pre-Procedure History & Physical: HPI:  Jeffrey Keller is a 38 y.o. male is here for an colonoscopy.   Past Medical History:  Diagnosis Date   Anxiety    COVID-19 11/2019   Depression    Diverticulitis    GERD (gastroesophageal reflux disease)    Hypertension    Insomnia    Obesity    Pneumonia    Covid pneumonia   Sleep apnea     Past Surgical History:  Procedure Laterality Date   APPENDECTOMY     COLONOSCOPY     ESOPHAGOGASTRODUODENOSCOPY N/A 04/12/2020   Procedure: UPPER GASTROINTESTINAL ENDOSCOPY;  Surgeon: Stevie, Herlene Righter, MD;  Location: [facility];  Service: General;  Laterality: N/A;   LAPAROSCOPIC GASTRIC SLEEVE RESECTION N/A 04/12/2020   Procedure: LAPAROSCOPIC SLEEVE GASTRECTOMY;  Surgeon: Stevie Herlene Righter, MD;  Location: [facility];  Service: General;  Laterality: N/A;   VASECTOMY     WISDOM TOOTH EXTRACTION      Prior to Admission medications   Medication Sig Start Date End Date Taking? Authorizing Provider  acetaminophen  (TYLENOL ) 500 MG tablet Take 2 tablets (1,000 mg total) by mouth every 6 (six) hours. 12/20/20   Simaan, Elizabeth S, PA-C  buPROPion (WELLBUTRIN XL) 150 MG 24 hr tablet Take 150 mg by mouth daily. 11/22/20   [provider]  Calcium Carbonate (CALCIUM 600 PO) Take 600 mg by mouth daily.    [provider]  Cetirizine  HCl 10 MG CAPS Take 1 capsule (10 mg total) by mouth daily for 10 days. 01/11/21 01/21/21  Wieters, Hallie C, PA-C  gabapentin  (NEURONTIN ) 100 MG capsule Take 2 capsules (200 mg total) by mouth every 12 (twelve) hours. 04/13/20   Kinsinger, Herlene Righter, MD  gabapentin  (NEURONTIN ) 300 MG capsule Take 1 capsule (300 mg total) by mouth 3 (three) times daily. 12/20/20   Simaan, Elizabeth S, PA-C  ibuprofen  (ADVIL ) 600 MG tablet Take 1 tablet (600 mg  total) by mouth every 8 (eight) hours as needed. 12/20/20   Simaan, Elizabeth S, PA-C  losartan  (COZAAR ) 50 MG tablet Take 50 mg by mouth daily. 01/07/20   [provider]  losartan  (COZAAR ) 50 MG tablet Take 50 mg by mouth daily. 10/03/20   [provider]  methocarbamol  (ROBAXIN ) 500 MG tablet Take 2 tablets (1,000 mg total) by mouth every 8 (eight) hours. 12/20/20   Simaan, Elizabeth S, PA-C  Multiple Vitamin (MULTIVITAMIN WITH MINERALS) TABS tablet Take 1 tablet by mouth daily. 12/21/20   Simaan, Elizabeth S, PA-C  ondansetron  (ZOFRAN -ODT) 4 MG disintegrating tablet Take 1 tablet (4 mg total) by mouth every 6 (six) hours as needed for nausea or vomiting. 04/13/20   Kinsinger, Herlene Righter, MD  oxyCODONE  (OXY IR/ROXICODONE ) 5 MG immediate release tablet Take by mouth. 12/28/20   [provider]  pantoprazole  (PROTONIX ) 40 MG tablet Take 1 tablet (40 mg total) by mouth daily. 04/13/20   Kinsinger, Herlene Righter, MD  traZODone (DESYREL) 50 MG tablet Take 50 mg by mouth at bedtime. 11/22/20   [provider]  venlafaxine XR (EFFEXOR-XR) 150 MG 24 hr capsule Take 150 mg by mouth daily. 02/04/20   [provider]  venlafaxine XR (EFFEXOR-XR) 75 MG 24 hr capsule Take 75 mg by mouth daily. 11/08/20   [provider]    Allergies as of  05/23/2024 - Review Complete 03/03/2024  Allergen Reaction Noted   Penicillins Other (See Comments) 09/23/2017   Penicillins Other (See Comments) 12/18/2020    No family history on file.  Social History   Socioeconomic History   Marital status: Married    Spouse name: Not on file   Number of children: Not on file   Years of education: Not on file   Highest education level: Not on file  Occupational History   Not on file  Tobacco Use   Smoking status: Former    Current packs/day: 0.50    Types: Cigarettes   Smokeless tobacco: Never   Tobacco comments:    Quit x1 week  Vaping Use   Vaping status: Never Used   Substance and Sexual Activity   Alcohol use: Yes    Comment: weekend   Drug use: No   Sexual activity: Not on file    Comment: Vasectomy  Other Topics Concern   Not on file  Social History Narrative   ** Merged History Encounter **       Social Drivers of Health   Financial Resource Strain: Low Risk  (02/25/2024)   Received from [facility]   Overall Financial Resource Strain (CARDIA)    Difficulty of Paying Living Expenses: Not hard at all  Food Insecurity: No Food Insecurity (02/25/2024)   Received from [facility]   Hunger Vital Sign    Within the past 12 months, you worried that your food would run out before you got the money to buy more.: Never true    Within the past 12 months, the food you bought just didn't last and you didn't have money to get more.: Never true  Transportation Needs: No Transportation Needs (02/25/2024)   Received from [facility] - Transportation    In the past 12 months, has lack of transportation kept you from medical appointments or from getting medications?: No    Lack of Transportation (Non-Medical): No  Physical Activity: Inactive (12/23/2020)   Received from [facility]   Exercise Vital Sign    On average, how many days per week do you engage in moderate to strenuous exercise (like a brisk walk)?: 0 days    On average, how many minutes do you engage in exercise at this level?: 0 min  Stress: Stress Concern Present (12/23/2020)   Received from [facility] - Occupational Stress Questionnaire    Feeling of Stress : Rather much  Social Connections: Moderately Integrated (12/23/2020)   Received from [facility]   Social Connection and Isolation Panel    In a typical week, how many times do you talk on the phone with family, friends, or neighbors?: More than three times a week    How often do you  get together with friends or relatives?: Three times a week    How often do you attend church or religious services?: 1 to 4 times per year    Do you belong to any clubs or organizations such as church groups, unions, fraternal or athletic groups, or school groups?: No    How often do you attend meetings of the clubs or organizations you belong to?: Never    Are you married, widowed, divorced, separated, never married, or living with a partner?: Married  Intimate Partner Violence: Not on file    Review of Systems: See HPI, otherwise negative  ROS  Physical Exam: BP 124/89   Pulse (!) 51   Temp (!) 96.9 F (36.1 C) (Tympanic)   Resp 16   Ht 6' (1.829 m)   Wt 106.4 kg   SpO2 99%   BMI 31.82 kg/m  General:   Alert,  pleasant and cooperative in NAD Head:  Normocephalic and atraumatic. Neck:  Supple; no masses or thyromegaly. Lungs:  Clear throughout to auscultation, normal respiratory effort.    Heart:  +S1, +S2, Regular rate and rhythm, No edema. Abdomen:  Soft, nontender and nondistended. Normal bowel sounds, without guarding, and without rebound.   Neurologic:  Alert and  oriented x4;  grossly normal neurologically.  Impression/Plan: Jeffrey Keller is here for an colonoscopy to be performed for surveillance due to prior history of colon polyps   Risks, benefits, limitations, and alternatives regarding  colonoscopy have been reviewed with the patient.  Questions have been answered.  All parties agreeable.   Ruel Kung, MD  05/28/2024, 7:45 AM

## 2024-05-28 NOTE — Anesthesia Preprocedure Evaluation (Signed)
 Anesthesia Evaluation  Patient identified by MRN, date of birth, ID band Patient awake    Reviewed: Allergy & Precautions, NPO status , Patient's Chart, lab work & pertinent test results  Airway Mallampati: II  TM Distance: >3 FB Neck ROM: full    Dental  (+) Teeth Intact   Pulmonary neg pulmonary ROS, sleep apnea , former smoker   Pulmonary exam normal        Cardiovascular Exercise Tolerance: Good hypertension, Pt. on medications negative cardio ROS Normal cardiovascular exam Rhythm:Regular Rate:Normal     Neuro/Psych   Anxiety     negative neurological ROS  negative psych ROS   GI/Hepatic negative GI ROS, Neg liver ROS,GERD  Medicated,,  Endo/Other  negative endocrine ROS  Class 3 obesity  Renal/GU negative Renal ROS  negative genitourinary   Musculoskeletal negative musculoskeletal ROS (+)    Abdominal   Peds negative pediatric ROS (+)  Hematology negative hematology ROS (+)   Anesthesia Other Findings Past Medical History: No date: Anxiety 11/2019: COVID-19 No date: Depression No date: Diverticulitis No date: GERD (gastroesophageal reflux disease) No date: Hypertension No date: Insomnia No date: Obesity No date: Pneumonia     Comment:  Covid pneumonia No date: Sleep apnea  Past Surgical History: No date: APPENDECTOMY No date: COLONOSCOPY 04/12/2020: ESOPHAGOGASTRODUODENOSCOPY; N/A     Comment:  Procedure: UPPER GASTROINTESTINAL ENDOSCOPY;  Surgeon:               Kinsinger, Herlene Righter, MD;  Location: [facility];  Service:               General;  Laterality: N/A; 04/12/2020: LAPAROSCOPIC GASTRIC SLEEVE RESECTION; N/A     Comment:  Procedure: LAPAROSCOPIC SLEEVE GASTRECTOMY;  Surgeon:               Kinsinger, Herlene Righter, MD;  Location: [facility];  Service:               General;  Laterality: N/A; No date: VASECTOMY No date: WISDOM TOOTH EXTRACTION  BMI    Body Mass Index: 31.82 kg/m       Reproductive/Obstetrics negative OB ROS                              Anesthesia Physical Anesthesia Plan  ASA: 2  Anesthesia Plan: General   Post-op Pain Management:    Induction: Intravenous  PONV Risk Score and Plan: Propofol  infusion and TIVA  Airway Management Planned: Natural Airway and Nasal Cannula  Additional Equipment:   Intra-op Plan:   Post-operative Plan:   Informed Consent: I have reviewed the patients History and Physical, chart, labs and discussed the procedure including the risks, benefits and alternatives for the proposed anesthesia with the patient or authorized representative who has indicated his/her understanding and acceptance.     Dental Advisory Given  Plan Discussed with: CRNA  Anesthesia Plan Comments:         Anesthesia Quick Evaluation
